# Patient Record
Sex: Female | Born: 1940
Health system: Southern US, Community
[De-identification: ages and names within clinical notes are randomized; demographics above are authoritative.]

## PROBLEM LIST (undated history)

## (undated) DIAGNOSIS — R112 Nausea with vomiting, unspecified: Secondary | ICD-10-CM

## (undated) DIAGNOSIS — E7800 Pure hypercholesterolemia, unspecified: Secondary | ICD-10-CM

## (undated) DIAGNOSIS — Z9889 Other specified postprocedural states: Secondary | ICD-10-CM

## (undated) DIAGNOSIS — H532 Diplopia: Secondary | ICD-10-CM

## (undated) HISTORY — DX: Pure hypercholesterolemia, unspecified: E78.00

## (undated) HISTORY — PX: ABDOMINAL HYSTERECTOMY: SHX81

## (undated) HISTORY — PX: BREAST SURGERY: SHX581

## (undated) HISTORY — DX: Diplopia: H53.2

## (undated) HISTORY — PX: OTHER SURGICAL HISTORY: SHX169

## (undated) HISTORY — PX: THYROID SURGERY: SHX805

---

## 1998-03-21 ENCOUNTER — Encounter: Payer: Self-pay | Admitting: *Deleted

## 1998-03-21 ENCOUNTER — Ambulatory Visit (HOSPITAL_COMMUNITY): Admission: RE | Admit: 1998-03-21 | Discharge: 1998-03-21 | Payer: Self-pay | Admitting: *Deleted

## 2000-08-22 ENCOUNTER — Other Ambulatory Visit: Admission: RE | Admit: 2000-08-22 | Discharge: 2000-08-22 | Payer: Self-pay | Admitting: Internal Medicine

## 2014-10-26 ENCOUNTER — Encounter (INDEPENDENT_AMBULATORY_CARE_PROVIDER_SITE_OTHER): Payer: Self-pay | Admitting: *Deleted

## 2014-12-13 ENCOUNTER — Ambulatory Visit (INDEPENDENT_AMBULATORY_CARE_PROVIDER_SITE_OTHER): Payer: Medicare Other | Admitting: Internal Medicine

## 2014-12-13 ENCOUNTER — Encounter (INDEPENDENT_AMBULATORY_CARE_PROVIDER_SITE_OTHER): Payer: Self-pay | Admitting: Internal Medicine

## 2014-12-13 ENCOUNTER — Telehealth (INDEPENDENT_AMBULATORY_CARE_PROVIDER_SITE_OTHER): Payer: Self-pay | Admitting: *Deleted

## 2014-12-13 ENCOUNTER — Other Ambulatory Visit (INDEPENDENT_AMBULATORY_CARE_PROVIDER_SITE_OTHER): Payer: Self-pay | Admitting: Internal Medicine

## 2014-12-13 VITALS — BP 146/80 | HR 64 | Temp 98.1°F | Ht 60.0 in | Wt 164.8 lb

## 2014-12-13 DIAGNOSIS — H532 Diplopia: Secondary | ICD-10-CM | POA: Diagnosis not present

## 2014-12-13 DIAGNOSIS — R195 Other fecal abnormalities: Secondary | ICD-10-CM | POA: Diagnosis not present

## 2014-12-13 DIAGNOSIS — E7800 Pure hypercholesterolemia, unspecified: Secondary | ICD-10-CM

## 2014-12-13 DIAGNOSIS — Z1211 Encounter for screening for malignant neoplasm of colon: Secondary | ICD-10-CM

## 2014-12-13 NOTE — Telephone Encounter (Signed)
Patient needs trilyte 

## 2014-12-13 NOTE — Progress Notes (Signed)
   Subjective:    Patient ID: Robin Chambers, female    DOB: 06-03-1940, 74 y.o.   MRN: [id number]  HPI Referred by Dr Woody Seller for positive stool card in July.  She denies seeing any blood. There has been no change in her stools. She tells me the day of the specimen who stool was loose. Appetite is good. No weight loss.  No acid reflux. There is no abdominal pain. No melena.  Really no GI problems. Her last colonoscopy was 19 yrs ago by Dr. Owens Shark in [place]. For screening purpose and was normal per patient.   10/14/2014 H and H 12.0 and 36.9, MCV 92, Platelet ct 260  Review of Systems Past Medical History  Diagnosis Date  . High cholesterol   . Double vision     left eye    Past Surgical History  Procedure Laterality Date  . Cataract surgery    . Thyroid surgery      for nodules.  . Breast surgery      x 2 for fibroids.  . Complete hypsterectomy      tumor that burst    No Known Allergies  No current outpatient prescriptions on file prior to visit.   No current facility-administered medications on file prior to visit.   No current outpatient prescriptions on file prior to visit.   No current facility-administered medications on file prior to visit.   Current Outpatient Prescriptions  Medication Sig Dispense Refill  . aspirin 81 MG tablet Take 81 mg by mouth daily.    Marland Kitchen docusate sodium (COLACE) 100 MG capsule Take 100 mg by mouth daily.    . fluticasone (FLONASE) 50 MCG/ACT nasal spray Place into both nostrils daily.    Marland Kitchen ketotifen (ZADITOR) 0.025 % ophthalmic solution Place 1 drop into both eyes daily.    . timolol (BETIMOL) 0.5 % ophthalmic solution Place 1 drop into the left eye daily.    Marland Kitchen tretinoin (RETIN-A) 0.1 % cream Apply topically at bedtime.     No current facility-administered medications for this visit.         Objective:   Physical Exam Blood pressure 146/80, pulse 64, temperature 98.1 F (36.7 C), height 5' (1.524 m), weight 164 lb 12.8 oz  (74.753 kg).  Alert and oriented. Skin warm and dry. Oral mucosa is moist.   . Sclera anicteric, conjunctivae is pink. Thyroid not enlarged. No cervical lymphadenopathy. Lungs clear. Heart regular rate and rhythm.  Abdomen is soft. Bowel sounds are positive. No hepatomegaly. No abdominal masses felt. No tenderness.  No edema to lower extremities.        Assessment & Plan:  Heme positive stool. Colonic neoplasm needs to be ruled out. Colonic polyp, AVM, diverticular disease also in the differential. Colonoscopy. The risks and benefits such as perforation, bleeding, and infection were reviewed with the patient and is agreeable.

## 2014-12-13 NOTE — Patient Instructions (Signed)
Colonoscopy.  The risks and benefits such as perforation, bleeding, and infection were reviewed with the patient and is agreeable. 

## 2014-12-17 MED ORDER — PEG 3350-KCL-NA BICARB-NACL 420 G PO SOLR: 4000.0000 mL | Freq: Once | ORAL | Status: DC

## 2014-12-24 ENCOUNTER — Encounter (INDEPENDENT_AMBULATORY_CARE_PROVIDER_SITE_OTHER): Payer: Self-pay

## 2015-01-26 ENCOUNTER — Encounter (HOSPITAL_COMMUNITY)
Admission: RE | Disposition: A | Discharge status: Home / Self Care | Payer: Self-pay | Source: Ambulatory Visit | Attending: Internal Medicine

## 2015-01-26 ENCOUNTER — Encounter (HOSPITAL_COMMUNITY): Payer: Self-pay | Admitting: *Deleted

## 2015-01-26 ENCOUNTER — Ambulatory Visit (HOSPITAL_COMMUNITY)
Admission: RE | Admit: 2015-01-26 | Discharge: 2015-01-26 | Disposition: A | Discharge status: Home / Self Care | Payer: Medicare Other | Source: Ambulatory Visit | Attending: Internal Medicine | Admitting: Internal Medicine

## 2015-01-26 DIAGNOSIS — Z90710 Acquired absence of both cervix and uterus: Secondary | ICD-10-CM | POA: Insufficient documentation

## 2015-01-26 DIAGNOSIS — Z79899 Other long term (current) drug therapy: Secondary | ICD-10-CM | POA: Insufficient documentation

## 2015-01-26 DIAGNOSIS — Z7982 Long term (current) use of aspirin: Secondary | ICD-10-CM | POA: Insufficient documentation

## 2015-01-26 DIAGNOSIS — R195 Other fecal abnormalities: Secondary | ICD-10-CM

## 2015-01-26 DIAGNOSIS — K5730 Diverticulosis of large intestine without perforation or abscess without bleeding: Secondary | ICD-10-CM | POA: Insufficient documentation

## 2015-01-26 DIAGNOSIS — E7800 Pure hypercholesterolemia, unspecified: Secondary | ICD-10-CM | POA: Insufficient documentation

## 2015-01-26 DIAGNOSIS — K921 Melena: Secondary | ICD-10-CM | POA: Diagnosis not present

## 2015-01-26 HISTORY — DX: Nausea with vomiting, unspecified: R11.2

## 2015-01-26 HISTORY — DX: Other specified postprocedural states: Z98.890

## 2015-01-26 HISTORY — PX: COLONOSCOPY: SHX5424

## 2015-01-26 SURGERY — COLONOSCOPY
Anesthesia: Moderate Sedation

## 2015-01-26 MED ORDER — MEPERIDINE HCL 50 MG/ML IJ SOLN
INTRAMUSCULAR | Status: AC
  Filled 2015-01-26: qty 1

## 2015-01-26 MED ORDER — STERILE WATER FOR IRRIGATION IR SOLN
Status: DC | PRN
  Administered 2015-01-26: 13:00:00

## 2015-01-26 MED ORDER — MIDAZOLAM HCL 5 MG/5ML IJ SOLN
INTRAMUSCULAR | Status: AC
  Filled 2015-01-26: qty 10

## 2015-01-26 MED ORDER — MEPERIDINE HCL 50 MG/ML IJ SOLN
INTRAMUSCULAR | Status: DC | PRN
  Administered 2015-01-26 (×2): 25 mg

## 2015-01-26 MED ORDER — SODIUM CHLORIDE 0.9 % IV SOLN
INTRAVENOUS | Status: DC
  Administered 2015-01-26: 1000 mL via INTRAVENOUS

## 2015-01-26 MED ORDER — MIDAZOLAM HCL 5 MG/5ML IJ SOLN
INTRAMUSCULAR | Status: DC | PRN
  Administered 2015-01-26: 1 mg via INTRAVENOUS
  Administered 2015-01-26 (×2): 2 mg via INTRAVENOUS

## 2015-01-26 NOTE — Discharge Instructions (Signed)
Resume usual medications and high fiber diet. No driving for 24 hours. Please have Dr. Woody Seller check CBC in 2 months.    Colonoscopy, Care After These instructions give you information on caring for yourself after your procedure. Your doctor may also give you more specific instructions. Call your doctor if you have any problems or questions after your procedure. HOME CARE  Do not drive for 24 hours.  Do not sign important papers or use machinery for 24 hours.  You may shower.  You may go back to your usual activities, but go slower for the first 24 hours.  Take rest breaks often during the first 24 hours.  Walk around or use warm packs on your belly (abdomen) if you have belly cramping or gas.  Drink enough fluids to keep your pee (urine) clear or pale yellow.  Resume your normal diet. Avoid heavy or fried foods.  Avoid drinking alcohol for 24 hours or as told by your doctor.  Only take medicines as told by your doctor. If a tissue sample (biopsy) was taken during the procedure:   Do not take aspirin or blood thinners for 7 days, or as told by your doctor.  Do not drink alcohol for 7 days, or as told by your doctor.  Eat soft foods for the first 24 hours. GET HELP IF: You still have a small amount of blood in your poop (stool) 2-3 days after the procedure. GET HELP RIGHT AWAY IF:  You have more than a small amount of blood in your poop.  You see clumps of tissue (blood clots) in your poop.  Your belly is puffy (swollen).  You feel sick to your stomach (nauseous) or throw up (vomit).  You have a fever.  You have belly pain that gets worse and medicine does not help. MAKE SURE YOU:  Understand these instructions.  Will watch your condition.  Will get help right away if you are not doing well or get worse.   This information is not intended to replace advice given to you by your health care provider. Make sure you discuss any questions you have with your health  care provider.   Document Released: 02/24/2010 Document Revised: 01/27/2013 Document Reviewed: 09/29/2012 Elsevier Interactive Patient Education [employer].     Diverticulosis Diverticulosis is the condition that develops when small pouches (diverticula) form in the wall of your colon. Your colon, or large intestine, is where water is absorbed and stool is formed. The pouches form when the inside layer of your colon pushes through weak spots in the outer layers of your colon. CAUSES  No one knows exactly what causes diverticulosis. RISK FACTORS  Being older than 73. Your risk for this condition increases with age. Diverticulosis is rare in people younger than 40 years. By age 29, almost everyone has it.  Eating a low-fiber diet.  Being frequently constipated.  Being overweight.  Not getting enough exercise.  Smoking.  Taking over-the-counter pain medicines, like aspirin and ibuprofen. SYMPTOMS  Most people with diverticulosis do not have symptoms. DIAGNOSIS  Because diverticulosis often has no symptoms, health care providers often discover the condition during an exam for other colon problems. In many cases, a health care provider will diagnose diverticulosis while using a flexible scope to examine the colon (colonoscopy). TREATMENT  If you have never developed an infection related to diverticulosis, you may not need treatment. If you have had an infection before, treatment may include:  Eating more fruits, vegetables, and grains.  Taking a fiber supplement.  Taking a live bacteria supplement (probiotic).  Taking medicine to relax your colon. HOME CARE INSTRUCTIONS   Drink at least 6-8 glasses of water each day to prevent constipation.  Try not to strain when you have a bowel movement.  Keep all follow-up appointments. If you have had an infection before:  Increase the fiber in your diet as directed by your health care provider or dietitian.  Take a  dietary fiber supplement if your health care provider approves.  Only take medicines as directed by your health care provider. SEEK MEDICAL CARE IF:   You have abdominal pain.  You have bloating.  You have cramps.  You have not gone to the bathroom in 3 days. SEEK IMMEDIATE MEDICAL CARE IF:   Your pain gets worse.  Yourbloating becomes very bad.  You have a fever or chills, and your symptoms suddenly get worse.  You begin vomiting.  You have bowel movements that are bloody or black. MAKE SURE YOU:  Understand these instructions.  Will watch your condition.  Will get help right away if you are not doing well or get worse.   This information is not intended to replace advice given to you by your health care provider. Make sure you discuss any questions you have with your health care provider.   Document Released: 10/20/2003 Document Revised: 01/27/2013 Document Reviewed: 12/17/2012 Elsevier Interactive Patient Education 2016 Elsevier Inc.   High-Fiber Diet Fiber, also called dietary fiber, is a type of carbohydrate found in fruits, vegetables, whole grains, and beans. A high-fiber diet can have many health benefits. Your health care provider may recommend a high-fiber diet to help:  Prevent constipation. Fiber can make your bowel movements more regular.  Lower your cholesterol.  Relieve hemorrhoids, uncomplicated diverticulosis, or irritable bowel syndrome.  Prevent overeating as part of a weight-loss plan.  Prevent heart disease, type 2 diabetes, and certain cancers. WHAT IS MY PLAN? The recommended daily intake of fiber includes:  38 grams for men under age 17.  32 grams for men over age 40.  21 grams for women under age 74.  38 grams for women over age 39. You can get the recommended daily intake of dietary fiber by eating a variety of fruits, vegetables, grains, and beans. Your health care provider may also recommend a fiber supplement if it is not  possible to get enough fiber through your diet. WHAT DO I NEED TO KNOW ABOUT A HIGH-FIBER DIET?  Fiber supplements have not been widely studied for their effectiveness, so it is better to get fiber through food sources.  Always check the fiber content on thenutrition facts label of any prepackaged food. Look for foods that contain at least 5 grams of fiber per serving.  Ask your dietitian if you have questions about specific foods that are related to your condition, especially if those foods are not listed in the following section.  Increase your daily fiber consumption gradually. Increasing your intake of dietary fiber too quickly may cause bloating, cramping, or gas.  Drink plenty of water. Water helps you to digest fiber. WHAT FOODS CAN I EAT? Grains Whole-grain breads. Multigrain cereal. Oats and oatmeal. Brown rice. Barley. Bulgur wheat. [place]. Bran muffins. Popcorn. Rye wafer crackers. Vegetables Sweet potatoes. Spinach. Kale. Artichokes. Cabbage. Broccoli. Green peas. Carrots. Squash. Fruits Berries. Pears. Apples. Oranges. Avocados. Prunes and raisins. Dried figs. Meats and Other Protein Sources Navy, kidney, pinto, and soy beans. Split peas. Lentils. Nuts and seeds. Dairy  Fiber-fortified yogurt. Beverages Fiber-fortified soy milk. Fiber-fortified orange juice. Other Fiber bars. The items listed above may not be a complete list of recommended foods or beverages. Contact your dietitian for more options. WHAT FOODS ARE NOT RECOMMENDED? Grains White bread. Pasta made with refined flour. White rice. Vegetables Fried potatoes. Canned vegetables. Well-cooked vegetables.  Fruits Fruit juice. Cooked, strained fruit. Meats and Other Protein Sources Fatty cuts of meat. Fried Sales executive or fried fish. Dairy Milk. Yogurt. Cream cheese. Sour cream. Beverages Soft drinks. Other Cakes and pastries. Butter and oils. The items listed above may not be a complete list of foods and  beverages to avoid. Contact your dietitian for more information. WHAT ARE SOME TIPS FOR INCLUDING HIGH-FIBER FOODS IN MY DIET?  Eat a wide variety of high-fiber foods.  Make sure that half of all grains consumed each day are whole grains.  Replace breads and cereals made from refined flour or white flour with whole-grain breads and cereals.  Replace white rice with brown rice, bulgur wheat, or millet.  Start the day with a breakfast that is high in fiber, such as a cereal that contains at least 5 grams of fiber per serving.  Use beans in place of meat in soups, salads, or pasta.  Eat high-fiber snacks, such as berries, raw vegetables, nuts, or popcorn.   This information is not intended to replace advice given to you by your health care provider. Make sure you discuss any questions you have with your health care provider.   Document Released: 01/22/2005 Document Revised: 02/12/2014 Document Reviewed: 07/07/2013 Elsevier Interactive Patient Education [employer].

## 2015-01-26 NOTE — H&P (Signed)
Robin Chambers is an 74 y.o. female.   Chief Complaint: Patient is here for colonoscopy. HPI: Patient is 74 year old Caucasian female is recently found to have heme-positive stool and is here for diagnostic colonoscopy. She denies abdominal pain change in bowel habits rectal bleeding. She is on low-dose aspirin and takes 1 Advil no more than once a week. Last colonoscopy was done over 10 years ago. Family History is negative for CRC.  Past Medical History  Diagnosis Date  . High cholesterol   . Double vision     left eye  . PONV (postoperative nausea and vomiting)     Past Surgical History  Procedure Laterality Date  . Cataract surgery    . Thyroid surgery      for nodules.  . Breast surgery      x 2 for fibroids.  . Complete hypsterectomy      tumor that burst  . Abdominal hysterectomy      History reviewed. No pertinent family history. Social History:  reports that she has never smoked. She does not have any smokeless tobacco history on file. She reports that she does not drink alcohol or use illicit drugs.  Allergies: No Known Allergies  Medications Prior to Admission  Medication Sig Dispense Refill  . acetaminophen (TYLENOL) 500 MG tablet Take 500 mg by mouth every 4 (four) hours as needed for moderate pain or fever.    Marland Kitchen aspirin 81 MG tablet Take 81 mg by mouth daily.    . Calcium Carbonate-Vit D-Min (CALCIUM 1200 PO) Take 1 tablet by mouth daily.    . cetirizine (ZYRTEC) 10 MG tablet Take 10 mg by mouth daily.    Marland Kitchen docusate sodium (COLACE) 100 MG capsule Take 100 mg by mouth daily.    . fluticasone (FLONASE) 50 MCG/ACT nasal spray Place 2 sprays into both nostrils daily.     . Multiple Vitamin (MULTIVITAMIN WITH MINERALS) TABS tablet Take 1 tablet by mouth daily.    . polyethylene glycol-electrolytes (NULYTELY/GOLYTELY) 420 G solution Take 4,000 mLs by mouth once. 4000 mL 0  . simvastatin (ZOCOR) 20 MG tablet Take 20 mg by mouth daily.    . timolol (BETIMOL) 0.5 %  ophthalmic solution Place 1 drop into the left eye daily.    . Vitamin D, Ergocalciferol, (DRISDOL) 50000 UNITS CAPS capsule Take 1 capsule by mouth once a week.    . tretinoin (RETIN-A) 0.1 % cream Apply 1 application topically at bedtime.       No results found for this or any previous visit (from the past 48 hour(s)). No results found.  ROS  Blood pressure 151/67, pulse 67, temperature 97.9 F (36.6 C), temperature source Oral, resp. rate 16, height 5\' 2"  (1.575 m), weight 160 lb (72.576 kg), SpO2 98 %. Physical Exam  Constitutional: She appears well-developed and well-nourished.  HENT:  Mouth/Throat: Oropharynx is clear and moist.  Left ear is covered with dressing.  Eyes: Conjunctivae are normal. No scleral icterus.  Neck: No thyromegaly present.  Cardiovascular: Normal rate, regular rhythm and normal heart sounds.   No murmur heard. Respiratory: Effort normal and breath sounds normal.  GI: Soft. She exhibits no distension and no mass. There is no tenderness.  Musculoskeletal: She exhibits no edema.  Lymphadenopathy:    She has no cervical adenopathy.  Neurological: She is alert.  Skin: Skin is warm and dry.     Assessment/Plan Heme positive stool. Diagnostic colonoscopy.  Andrue Dini U 01/26/2015, 12:31 PM

## 2015-01-26 NOTE — Op Note (Signed)
COLONOSCOPY PROCEDURE REPORT  PATIENT:  Robin Chambers  MR#:  [id number] Birthdate:  10-Feb-1940, 74 y.o., female Endoscopist:  Dr. Rogene Houston, MD Referred By:  Dr. Glenda Chroman, MD Procedure Date: 01/26/2015  Procedure:   Colonoscopy  Indications:  Patient is 74 year old Caucasian female who was recently noted have heme-positive stool. Her H&H was normal. She is undergoing diagnostic colonoscopy. She denies abdominal pain melena or rectal bleeding or recent change in her bowel habits. Last colonoscopy was several years ago.  Informed Consent:  The procedure and risks were reviewed with the patient and informed consent was obtained.  Medications:  Demerol 50 mg IV Versed 5 mg IV  Description of procedure:  After a digital rectal exam was performed, that colonoscope was advanced from the anus through the rectum and colon to the area of the cecum, ileocecal valve and appendiceal orifice. The cecum was deeply intubated. These structures were well-seen and photographed for the record. From the level of the cecum and ileocecal valve, the scope was slowly and cautiously withdrawn. The mucosal surfaces were carefully surveyed utilizing scope tip to flexion to facilitate fold flattening as needed. The scope was pulled down into the rectum where a thorough exam including retroflexion was performed.  Findings:   Prep satisfactory. Normal mucosa of cecum, ascending colon, hepatic flexure, transverse colon, splenic flexure and descending colon. Few small scattered diverticula noted at sigmoid colon. Normal rectal mucosa and anal rectal junction.   Therapeutic/Diagnostic Maneuvers Performed:   None  Complications:  None  EBL: None  Cecal Withdrawal Time:  8 minutes  Impression:  Mild sigmoid colon diverticulosis otherwise normal colonoscopy.  Recommendations:  Standard instructions given. High-fiber diet. No further workup other than CBC in two months by Dr. Woody Seller.  [name]   01/26/2015 1:11 PM  CC: Dr. Glenda Chroman., MD & Dr. Rayne Du ref. provider found

## 2015-02-01 ENCOUNTER — Encounter (HOSPITAL_COMMUNITY): Payer: Self-pay | Admitting: Internal Medicine

## 2015-02-16 DIAGNOSIS — H401422 Capsular glaucoma with pseudoexfoliation of lens, left eye, moderate stage: Secondary | ICD-10-CM | POA: Diagnosis not present

## 2015-02-16 DIAGNOSIS — Z961 Presence of intraocular lens: Secondary | ICD-10-CM | POA: Diagnosis not present

## 2015-02-16 DIAGNOSIS — H04123 Dry eye syndrome of bilateral lacrimal glands: Secondary | ICD-10-CM | POA: Diagnosis not present

## 2015-02-21 ENCOUNTER — Telehealth (INDEPENDENT_AMBULATORY_CARE_PROVIDER_SITE_OTHER): Payer: Self-pay | Admitting: Internal Medicine

## 2015-02-21 DIAGNOSIS — K921 Melena: Secondary | ICD-10-CM

## 2015-02-21 NOTE — Telephone Encounter (Signed)
Patient was called and advised that the lab work was due 03/29/15. A order will be faxed to [facility] per Tina's instruction.

## 2015-02-21 NOTE — Telephone Encounter (Signed)
Per Dr.Rehman's note- the patient is to have a CBC in two months by Dr.Vyas. Called their office and was told be Otila Kluver that we would need to do the order and she would give it to the lab person. Not due until February as the TCS was 01/26/15. Order will be faxed to [facility].

## 2015-02-21 NOTE — Telephone Encounter (Signed)
Ms. Bellew left a message on the voice mail saying Dr. Laural Golden suggested she go to [facility] after her colonoscopy for a "CBC" follow-up. She's wondering if she needs a referral or if she can just call and make an appt. She'd like a phone call regarding this.  Pt's ph# [phone] or [phone]  Thank you.

## 2015-03-24 DIAGNOSIS — L570 Actinic keratosis: Secondary | ICD-10-CM | POA: Diagnosis not present

## 2015-03-24 DIAGNOSIS — Z85828 Personal history of other malignant neoplasm of skin: Secondary | ICD-10-CM | POA: Diagnosis not present

## 2015-03-29 DIAGNOSIS — D649 Anemia, unspecified: Secondary | ICD-10-CM | POA: Diagnosis not present

## 2015-04-14 ENCOUNTER — Encounter (INDEPENDENT_AMBULATORY_CARE_PROVIDER_SITE_OTHER): Payer: Self-pay

## 2015-05-12 DIAGNOSIS — S00411A Abrasion of right ear, initial encounter: Secondary | ICD-10-CM | POA: Diagnosis not present

## 2015-06-07 DIAGNOSIS — H00025 Hordeolum internum left lower eyelid: Secondary | ICD-10-CM | POA: Diagnosis not present

## 2015-06-17 DIAGNOSIS — H00025 Hordeolum internum left lower eyelid: Secondary | ICD-10-CM | POA: Diagnosis not present

## 2015-06-27 DIAGNOSIS — Z85828 Personal history of other malignant neoplasm of skin: Secondary | ICD-10-CM | POA: Diagnosis not present

## 2015-06-27 DIAGNOSIS — H01006 Unspecified blepharitis left eye, unspecified eyelid: Secondary | ICD-10-CM | POA: Diagnosis not present

## 2015-06-27 DIAGNOSIS — H00035 Abscess of left lower eyelid: Secondary | ICD-10-CM | POA: Diagnosis not present

## 2015-06-27 DIAGNOSIS — H6001 Abscess of right external ear: Secondary | ICD-10-CM | POA: Diagnosis not present

## 2015-06-27 DIAGNOSIS — L821 Other seborrheic keratosis: Secondary | ICD-10-CM | POA: Diagnosis not present

## 2015-08-15 DIAGNOSIS — Z961 Presence of intraocular lens: Secondary | ICD-10-CM | POA: Diagnosis not present

## 2015-08-15 DIAGNOSIS — H0014 Chalazion left upper eyelid: Secondary | ICD-10-CM | POA: Diagnosis not present

## 2015-08-26 DIAGNOSIS — R351 Nocturia: Secondary | ICD-10-CM | POA: Diagnosis not present

## 2015-08-26 DIAGNOSIS — R350 Frequency of micturition: Secondary | ICD-10-CM | POA: Diagnosis not present

## 2015-08-26 DIAGNOSIS — R3129 Other microscopic hematuria: Secondary | ICD-10-CM | POA: Diagnosis not present

## 2015-08-30 DIAGNOSIS — Z789 Other specified health status: Secondary | ICD-10-CM | POA: Diagnosis not present

## 2015-08-30 DIAGNOSIS — Z299 Encounter for prophylactic measures, unspecified: Secondary | ICD-10-CM | POA: Diagnosis not present

## 2015-08-30 DIAGNOSIS — H00019 Hordeolum externum unspecified eye, unspecified eyelid: Secondary | ICD-10-CM | POA: Diagnosis not present

## 2015-09-08 DIAGNOSIS — H01021 Squamous blepharitis right upper eyelid: Secondary | ICD-10-CM | POA: Diagnosis not present

## 2015-09-08 DIAGNOSIS — H01024 Squamous blepharitis left upper eyelid: Secondary | ICD-10-CM | POA: Diagnosis not present

## 2015-09-08 DIAGNOSIS — H01025 Squamous blepharitis left lower eyelid: Secondary | ICD-10-CM | POA: Diagnosis not present

## 2015-09-08 DIAGNOSIS — Z961 Presence of intraocular lens: Secondary | ICD-10-CM | POA: Diagnosis not present

## 2015-09-08 DIAGNOSIS — H00024 Hordeolum internum left upper eyelid: Secondary | ICD-10-CM | POA: Diagnosis not present

## 2015-09-08 DIAGNOSIS — H01022 Squamous blepharitis right lower eyelid: Secondary | ICD-10-CM | POA: Diagnosis not present

## 2015-09-21 DIAGNOSIS — L720 Epidermal cyst: Secondary | ICD-10-CM | POA: Diagnosis not present

## 2015-09-21 DIAGNOSIS — Z85828 Personal history of other malignant neoplasm of skin: Secondary | ICD-10-CM | POA: Diagnosis not present

## 2015-09-21 DIAGNOSIS — L570 Actinic keratosis: Secondary | ICD-10-CM | POA: Diagnosis not present

## 2015-10-12 DIAGNOSIS — Z961 Presence of intraocular lens: Secondary | ICD-10-CM | POA: Diagnosis not present

## 2015-10-12 DIAGNOSIS — H01024 Squamous blepharitis left upper eyelid: Secondary | ICD-10-CM | POA: Diagnosis not present

## 2015-10-12 DIAGNOSIS — H01021 Squamous blepharitis right upper eyelid: Secondary | ICD-10-CM | POA: Diagnosis not present

## 2015-10-12 DIAGNOSIS — H01022 Squamous blepharitis right lower eyelid: Secondary | ICD-10-CM | POA: Diagnosis not present

## 2015-10-12 DIAGNOSIS — H01025 Squamous blepharitis left lower eyelid: Secondary | ICD-10-CM | POA: Diagnosis not present

## 2015-10-20 DIAGNOSIS — Z7189 Other specified counseling: Secondary | ICD-10-CM | POA: Diagnosis not present

## 2015-10-20 DIAGNOSIS — R5383 Other fatigue: Secondary | ICD-10-CM | POA: Diagnosis not present

## 2015-10-20 DIAGNOSIS — Z79899 Other long term (current) drug therapy: Secondary | ICD-10-CM | POA: Diagnosis not present

## 2015-10-20 DIAGNOSIS — Z6828 Body mass index (BMI) 28.0-28.9, adult: Secondary | ICD-10-CM | POA: Diagnosis not present

## 2015-10-20 DIAGNOSIS — Z1211 Encounter for screening for malignant neoplasm of colon: Secondary | ICD-10-CM | POA: Diagnosis not present

## 2015-10-20 DIAGNOSIS — Z1389 Encounter for screening for other disorder: Secondary | ICD-10-CM | POA: Diagnosis not present

## 2015-10-20 DIAGNOSIS — Z0000 Encounter for general adult medical examination without abnormal findings: Secondary | ICD-10-CM | POA: Diagnosis not present

## 2015-10-20 DIAGNOSIS — E559 Vitamin D deficiency, unspecified: Secondary | ICD-10-CM | POA: Diagnosis not present

## 2015-10-20 DIAGNOSIS — Z299 Encounter for prophylactic measures, unspecified: Secondary | ICD-10-CM | POA: Diagnosis not present

## 2015-10-20 DIAGNOSIS — E7800 Pure hypercholesterolemia, unspecified: Secondary | ICD-10-CM | POA: Diagnosis not present

## 2015-10-24 DIAGNOSIS — Z1231 Encounter for screening mammogram for malignant neoplasm of breast: Secondary | ICD-10-CM | POA: Diagnosis not present

## 2015-10-31 DIAGNOSIS — M25579 Pain in unspecified ankle and joints of unspecified foot: Secondary | ICD-10-CM | POA: Diagnosis not present

## 2015-10-31 DIAGNOSIS — M79671 Pain in right foot: Secondary | ICD-10-CM | POA: Diagnosis not present

## 2015-11-02 DIAGNOSIS — I10 Essential (primary) hypertension: Secondary | ICD-10-CM | POA: Diagnosis not present

## 2015-11-02 DIAGNOSIS — Z6829 Body mass index (BMI) 29.0-29.9, adult: Secondary | ICD-10-CM | POA: Diagnosis not present

## 2015-11-02 DIAGNOSIS — Z713 Dietary counseling and surveillance: Secondary | ICD-10-CM | POA: Diagnosis not present

## 2015-11-28 DIAGNOSIS — M76821 Posterior tibial tendinitis, right leg: Secondary | ICD-10-CM | POA: Diagnosis not present

## 2015-11-28 DIAGNOSIS — M79671 Pain in right foot: Secondary | ICD-10-CM | POA: Diagnosis not present

## 2015-11-28 DIAGNOSIS — M25579 Pain in unspecified ankle and joints of unspecified foot: Secondary | ICD-10-CM | POA: Diagnosis not present

## 2015-11-28 DIAGNOSIS — Z23 Encounter for immunization: Secondary | ICD-10-CM | POA: Diagnosis not present

## 2015-12-05 DIAGNOSIS — M79671 Pain in right foot: Secondary | ICD-10-CM | POA: Diagnosis not present

## 2015-12-05 DIAGNOSIS — M722 Plantar fascial fibromatosis: Secondary | ICD-10-CM | POA: Diagnosis not present

## 2015-12-05 DIAGNOSIS — M25579 Pain in unspecified ankle and joints of unspecified foot: Secondary | ICD-10-CM | POA: Diagnosis not present

## 2015-12-05 DIAGNOSIS — M79672 Pain in left foot: Secondary | ICD-10-CM | POA: Diagnosis not present

## 2015-12-21 DIAGNOSIS — L570 Actinic keratosis: Secondary | ICD-10-CM | POA: Diagnosis not present

## 2015-12-21 DIAGNOSIS — Z85828 Personal history of other malignant neoplasm of skin: Secondary | ICD-10-CM | POA: Diagnosis not present

## 2016-01-10 DIAGNOSIS — M79671 Pain in right foot: Secondary | ICD-10-CM | POA: Diagnosis not present

## 2016-01-10 DIAGNOSIS — M76821 Posterior tibial tendinitis, right leg: Secondary | ICD-10-CM | POA: Diagnosis not present

## 2016-01-10 DIAGNOSIS — M25579 Pain in unspecified ankle and joints of unspecified foot: Secondary | ICD-10-CM | POA: Diagnosis not present

## 2016-01-10 DIAGNOSIS — Z23 Encounter for immunization: Secondary | ICD-10-CM | POA: Diagnosis not present

## 2016-01-29 DIAGNOSIS — S50311A Abrasion of right elbow, initial encounter: Secondary | ICD-10-CM | POA: Diagnosis present

## 2016-01-29 DIAGNOSIS — M80051A Age-related osteoporosis with current pathological fracture, right femur, initial encounter for fracture: Secondary | ICD-10-CM | POA: Diagnosis present

## 2016-01-29 DIAGNOSIS — M160 Bilateral primary osteoarthritis of hip: Secondary | ICD-10-CM | POA: Diagnosis not present

## 2016-01-29 DIAGNOSIS — Z7982 Long term (current) use of aspirin: Secondary | ICD-10-CM | POA: Diagnosis not present

## 2016-01-29 DIAGNOSIS — T148XXA Other injury of unspecified body region, initial encounter: Secondary | ICD-10-CM | POA: Diagnosis not present

## 2016-01-29 DIAGNOSIS — Z66 Do not resuscitate: Secondary | ICD-10-CM | POA: Diagnosis not present

## 2016-01-29 DIAGNOSIS — H409 Unspecified glaucoma: Secondary | ICD-10-CM | POA: Diagnosis present

## 2016-01-29 DIAGNOSIS — M76892 Other specified enthesopathies of left lower limb, excluding foot: Secondary | ICD-10-CM | POA: Diagnosis not present

## 2016-01-29 DIAGNOSIS — Z9181 History of falling: Secondary | ICD-10-CM | POA: Diagnosis not present

## 2016-01-29 DIAGNOSIS — S50312A Abrasion of left elbow, initial encounter: Secondary | ICD-10-CM | POA: Diagnosis present

## 2016-01-29 DIAGNOSIS — Z79899 Other long term (current) drug therapy: Secondary | ICD-10-CM | POA: Diagnosis not present

## 2016-01-29 DIAGNOSIS — M8000XD Age-related osteoporosis with current pathological fracture, unspecified site, subsequent encounter for fracture with routine healing: Secondary | ICD-10-CM | POA: Diagnosis not present

## 2016-01-29 DIAGNOSIS — E785 Hyperlipidemia, unspecified: Secondary | ICD-10-CM | POA: Diagnosis present

## 2016-01-29 DIAGNOSIS — B9689 Other specified bacterial agents as the cause of diseases classified elsewhere: Secondary | ICD-10-CM | POA: Diagnosis not present

## 2016-01-29 DIAGNOSIS — S72111A Displaced fracture of greater trochanter of right femur, initial encounter for closed fracture: Secondary | ICD-10-CM | POA: Diagnosis not present

## 2016-01-29 DIAGNOSIS — M533 Sacrococcygeal disorders, not elsewhere classified: Secondary | ICD-10-CM | POA: Diagnosis not present

## 2016-01-29 DIAGNOSIS — E079 Disorder of thyroid, unspecified: Secondary | ICD-10-CM | POA: Diagnosis not present

## 2016-01-29 DIAGNOSIS — Z4789 Encounter for other orthopedic aftercare: Secondary | ICD-10-CM | POA: Diagnosis not present

## 2016-01-29 DIAGNOSIS — M5136 Other intervertebral disc degeneration, lumbar region: Secondary | ICD-10-CM | POA: Diagnosis not present

## 2016-01-29 DIAGNOSIS — M810 Age-related osteoporosis without current pathological fracture: Secondary | ICD-10-CM | POA: Diagnosis not present

## 2016-01-29 DIAGNOSIS — Z7409 Other reduced mobility: Secondary | ICD-10-CM | POA: Diagnosis not present

## 2016-01-29 DIAGNOSIS — M1990 Unspecified osteoarthritis, unspecified site: Secondary | ICD-10-CM | POA: Diagnosis present

## 2016-01-29 DIAGNOSIS — S72114D Nondisplaced fracture of greater trochanter of right femur, subsequent encounter for closed fracture with routine healing: Secondary | ICD-10-CM | POA: Diagnosis not present

## 2016-01-29 DIAGNOSIS — R262 Difficulty in walking, not elsewhere classified: Secondary | ICD-10-CM | POA: Diagnosis not present

## 2016-01-29 DIAGNOSIS — S72001A Fracture of unspecified part of neck of right femur, initial encounter for closed fracture: Secondary | ICD-10-CM | POA: Diagnosis not present

## 2016-01-29 DIAGNOSIS — R9431 Abnormal electrocardiogram [ECG] [EKG]: Secondary | ICD-10-CM | POA: Diagnosis not present

## 2016-01-29 DIAGNOSIS — W108XXA Fall (on) (from) other stairs and steps, initial encounter: Secondary | ICD-10-CM | POA: Diagnosis not present

## 2016-01-29 DIAGNOSIS — M1612 Unilateral primary osteoarthritis, left hip: Secondary | ICD-10-CM | POA: Diagnosis not present

## 2016-01-29 DIAGNOSIS — E039 Hypothyroidism, unspecified: Secondary | ICD-10-CM | POA: Diagnosis not present

## 2016-01-29 DIAGNOSIS — Z90710 Acquired absence of both cervix and uterus: Secondary | ICD-10-CM | POA: Diagnosis not present

## 2016-01-29 DIAGNOSIS — S79912A Unspecified injury of left hip, initial encounter: Secondary | ICD-10-CM | POA: Diagnosis not present

## 2016-01-29 DIAGNOSIS — D649 Anemia, unspecified: Secondary | ICD-10-CM | POA: Diagnosis not present

## 2016-01-29 DIAGNOSIS — M25551 Pain in right hip: Secondary | ICD-10-CM | POA: Diagnosis not present

## 2016-01-29 DIAGNOSIS — M6281 Muscle weakness (generalized): Secondary | ICD-10-CM | POA: Diagnosis not present

## 2016-01-29 DIAGNOSIS — S79911A Unspecified injury of right hip, initial encounter: Secondary | ICD-10-CM | POA: Diagnosis not present

## 2016-01-29 DIAGNOSIS — S72114A Nondisplaced fracture of greater trochanter of right femur, initial encounter for closed fracture: Secondary | ICD-10-CM | POA: Diagnosis not present

## 2016-01-29 DIAGNOSIS — M8580 Other specified disorders of bone density and structure, unspecified site: Secondary | ICD-10-CM | POA: Diagnosis present

## 2016-01-29 DIAGNOSIS — M25552 Pain in left hip: Secondary | ICD-10-CM | POA: Diagnosis not present

## 2016-01-29 DIAGNOSIS — J019 Acute sinusitis, unspecified: Secondary | ICD-10-CM | POA: Diagnosis not present

## 2016-02-01 DIAGNOSIS — M810 Age-related osteoporosis without current pathological fracture: Secondary | ICD-10-CM | POA: Diagnosis not present

## 2016-02-01 DIAGNOSIS — B9689 Other specified bacterial agents as the cause of diseases classified elsewhere: Secondary | ICD-10-CM | POA: Diagnosis not present

## 2016-02-01 DIAGNOSIS — M5136 Other intervertebral disc degeneration, lumbar region: Secondary | ICD-10-CM | POA: Diagnosis not present

## 2016-02-01 DIAGNOSIS — M533 Sacrococcygeal disorders, not elsewhere classified: Secondary | ICD-10-CM | POA: Diagnosis not present

## 2016-02-01 DIAGNOSIS — E079 Disorder of thyroid, unspecified: Secondary | ICD-10-CM | POA: Diagnosis not present

## 2016-02-01 DIAGNOSIS — M8580 Other specified disorders of bone density and structure, unspecified site: Secondary | ICD-10-CM | POA: Diagnosis not present

## 2016-02-01 DIAGNOSIS — M1612 Unilateral primary osteoarthritis, left hip: Secondary | ICD-10-CM | POA: Diagnosis not present

## 2016-02-01 DIAGNOSIS — J301 Allergic rhinitis due to pollen: Secondary | ICD-10-CM | POA: Diagnosis not present

## 2016-02-01 DIAGNOSIS — S72114D Nondisplaced fracture of greater trochanter of right femur, subsequent encounter for closed fracture with routine healing: Secondary | ICD-10-CM | POA: Diagnosis not present

## 2016-02-01 DIAGNOSIS — M6281 Muscle weakness (generalized): Secondary | ICD-10-CM | POA: Diagnosis not present

## 2016-02-01 DIAGNOSIS — M130 Polyarthritis, unspecified: Secondary | ICD-10-CM | POA: Diagnosis not present

## 2016-02-01 DIAGNOSIS — M8000XD Age-related osteoporosis with current pathological fracture, unspecified site, subsequent encounter for fracture with routine healing: Secondary | ICD-10-CM | POA: Diagnosis not present

## 2016-02-01 DIAGNOSIS — M8000XA Age-related osteoporosis with current pathological fracture, unspecified site, initial encounter for fracture: Secondary | ICD-10-CM | POA: Diagnosis not present

## 2016-02-01 DIAGNOSIS — R29890 Loss of height: Secondary | ICD-10-CM | POA: Diagnosis not present

## 2016-02-01 DIAGNOSIS — S72114A Nondisplaced fracture of greater trochanter of right femur, initial encounter for closed fracture: Secondary | ICD-10-CM | POA: Diagnosis not present

## 2016-02-01 DIAGNOSIS — J019 Acute sinusitis, unspecified: Secondary | ICD-10-CM | POA: Diagnosis not present

## 2016-02-01 DIAGNOSIS — B351 Tinea unguium: Secondary | ICD-10-CM | POA: Diagnosis not present

## 2016-02-01 DIAGNOSIS — R262 Difficulty in walking, not elsewhere classified: Secondary | ICD-10-CM | POA: Diagnosis not present

## 2016-02-01 DIAGNOSIS — Z8262 Family history of osteoporosis: Secondary | ICD-10-CM | POA: Diagnosis not present

## 2016-02-01 DIAGNOSIS — H409 Unspecified glaucoma: Secondary | ICD-10-CM | POA: Diagnosis not present

## 2016-02-01 DIAGNOSIS — Z7409 Other reduced mobility: Secondary | ICD-10-CM | POA: Diagnosis not present

## 2016-02-01 DIAGNOSIS — I10 Essential (primary) hypertension: Secondary | ICD-10-CM | POA: Diagnosis not present

## 2016-02-01 DIAGNOSIS — E785 Hyperlipidemia, unspecified: Secondary | ICD-10-CM | POA: Diagnosis not present

## 2016-02-01 DIAGNOSIS — M25551 Pain in right hip: Secondary | ICD-10-CM | POA: Diagnosis not present

## 2016-02-01 DIAGNOSIS — M76892 Other specified enthesopathies of left lower limb, excluding foot: Secondary | ICD-10-CM | POA: Diagnosis not present

## 2016-02-01 DIAGNOSIS — E039 Hypothyroidism, unspecified: Secondary | ICD-10-CM | POA: Diagnosis not present

## 2016-02-01 DIAGNOSIS — Z9181 History of falling: Secondary | ICD-10-CM | POA: Diagnosis not present

## 2016-02-01 DIAGNOSIS — M79676 Pain in unspecified toe(s): Secondary | ICD-10-CM | POA: Diagnosis not present

## 2016-02-01 DIAGNOSIS — E782 Mixed hyperlipidemia: Secondary | ICD-10-CM | POA: Diagnosis not present

## 2016-02-01 DIAGNOSIS — Z4789 Encounter for other orthopedic aftercare: Secondary | ICD-10-CM | POA: Diagnosis not present

## 2016-02-02 DIAGNOSIS — S72114A Nondisplaced fracture of greater trochanter of right femur, initial encounter for closed fracture: Secondary | ICD-10-CM | POA: Diagnosis not present

## 2016-02-02 DIAGNOSIS — M8000XA Age-related osteoporosis with current pathological fracture, unspecified site, initial encounter for fracture: Secondary | ICD-10-CM | POA: Diagnosis not present

## 2016-02-02 DIAGNOSIS — I10 Essential (primary) hypertension: Secondary | ICD-10-CM | POA: Diagnosis not present

## 2016-02-02 DIAGNOSIS — E782 Mixed hyperlipidemia: Secondary | ICD-10-CM | POA: Diagnosis not present

## 2016-02-07 DIAGNOSIS — J019 Acute sinusitis, unspecified: Secondary | ICD-10-CM | POA: Diagnosis not present

## 2016-02-10 DIAGNOSIS — B9689 Other specified bacterial agents as the cause of diseases classified elsewhere: Secondary | ICD-10-CM | POA: Diagnosis not present

## 2016-02-10 DIAGNOSIS — J019 Acute sinusitis, unspecified: Secondary | ICD-10-CM | POA: Diagnosis not present

## 2016-02-14 DIAGNOSIS — I10 Essential (primary) hypertension: Secondary | ICD-10-CM | POA: Diagnosis not present

## 2016-02-14 DIAGNOSIS — M130 Polyarthritis, unspecified: Secondary | ICD-10-CM | POA: Diagnosis not present

## 2016-02-14 DIAGNOSIS — J301 Allergic rhinitis due to pollen: Secondary | ICD-10-CM | POA: Diagnosis not present

## 2016-02-14 DIAGNOSIS — E039 Hypothyroidism, unspecified: Secondary | ICD-10-CM | POA: Diagnosis not present

## 2016-02-17 DIAGNOSIS — J019 Acute sinusitis, unspecified: Secondary | ICD-10-CM | POA: Diagnosis not present

## 2016-02-21 DIAGNOSIS — M79676 Pain in unspecified toe(s): Secondary | ICD-10-CM | POA: Diagnosis not present

## 2016-02-21 DIAGNOSIS — S72114A Nondisplaced fracture of greater trochanter of right femur, initial encounter for closed fracture: Secondary | ICD-10-CM | POA: Diagnosis not present

## 2016-02-21 DIAGNOSIS — R29890 Loss of height: Secondary | ICD-10-CM | POA: Diagnosis not present

## 2016-02-21 DIAGNOSIS — B351 Tinea unguium: Secondary | ICD-10-CM | POA: Diagnosis not present

## 2016-02-21 DIAGNOSIS — Z8262 Family history of osteoporosis: Secondary | ICD-10-CM | POA: Diagnosis not present

## 2016-02-21 DIAGNOSIS — M810 Age-related osteoporosis without current pathological fracture: Secondary | ICD-10-CM | POA: Diagnosis not present

## 2016-02-21 DIAGNOSIS — Z9181 History of falling: Secondary | ICD-10-CM | POA: Diagnosis not present

## 2016-02-21 DIAGNOSIS — M8580 Other specified disorders of bone density and structure, unspecified site: Secondary | ICD-10-CM | POA: Diagnosis not present

## 2016-02-21 DIAGNOSIS — E039 Hypothyroidism, unspecified: Secondary | ICD-10-CM | POA: Diagnosis not present

## 2016-02-27 DIAGNOSIS — M8000XD Age-related osteoporosis with current pathological fracture, unspecified site, subsequent encounter for fracture with routine healing: Secondary | ICD-10-CM | POA: Diagnosis not present

## 2016-02-27 DIAGNOSIS — S72114D Nondisplaced fracture of greater trochanter of right femur, subsequent encounter for closed fracture with routine healing: Secondary | ICD-10-CM | POA: Diagnosis not present

## 2016-02-27 DIAGNOSIS — I10 Essential (primary) hypertension: Secondary | ICD-10-CM | POA: Diagnosis not present

## 2016-02-27 DIAGNOSIS — E782 Mixed hyperlipidemia: Secondary | ICD-10-CM | POA: Diagnosis not present

## 2016-03-01 DIAGNOSIS — M810 Age-related osteoporosis without current pathological fracture: Secondary | ICD-10-CM | POA: Diagnosis not present

## 2016-03-01 DIAGNOSIS — S72114A Nondisplaced fracture of greater trochanter of right femur, initial encounter for closed fracture: Secondary | ICD-10-CM | POA: Diagnosis not present

## 2016-03-01 DIAGNOSIS — Z8262 Family history of osteoporosis: Secondary | ICD-10-CM | POA: Diagnosis not present

## 2016-03-01 DIAGNOSIS — Z9181 History of falling: Secondary | ICD-10-CM | POA: Diagnosis not present

## 2016-03-01 DIAGNOSIS — Z79899 Other long term (current) drug therapy: Secondary | ICD-10-CM | POA: Diagnosis not present

## 2016-03-01 DIAGNOSIS — E785 Hyperlipidemia, unspecified: Secondary | ICD-10-CM | POA: Diagnosis not present

## 2016-03-01 DIAGNOSIS — S72141D Displaced intertrochanteric fracture of right femur, subsequent encounter for closed fracture with routine healing: Secondary | ICD-10-CM | POA: Diagnosis not present

## 2016-03-01 DIAGNOSIS — Z7982 Long term (current) use of aspirin: Secondary | ICD-10-CM | POA: Diagnosis not present

## 2016-03-01 DIAGNOSIS — R29890 Loss of height: Secondary | ICD-10-CM | POA: Diagnosis not present

## 2016-03-01 DIAGNOSIS — E039 Hypothyroidism, unspecified: Secondary | ICD-10-CM | POA: Diagnosis not present

## 2016-03-16 DIAGNOSIS — R262 Difficulty in walking, not elsewhere classified: Secondary | ICD-10-CM | POA: Diagnosis not present

## 2016-03-16 DIAGNOSIS — M6281 Muscle weakness (generalized): Secondary | ICD-10-CM | POA: Diagnosis not present

## 2016-03-16 DIAGNOSIS — S72114D Nondisplaced fracture of greater trochanter of right femur, subsequent encounter for closed fracture with routine healing: Secondary | ICD-10-CM | POA: Diagnosis not present

## 2016-03-16 DIAGNOSIS — M25551 Pain in right hip: Secondary | ICD-10-CM | POA: Diagnosis not present

## 2016-03-19 DIAGNOSIS — R262 Difficulty in walking, not elsewhere classified: Secondary | ICD-10-CM | POA: Diagnosis not present

## 2016-03-19 DIAGNOSIS — M6281 Muscle weakness (generalized): Secondary | ICD-10-CM | POA: Diagnosis not present

## 2016-03-19 DIAGNOSIS — M25551 Pain in right hip: Secondary | ICD-10-CM | POA: Diagnosis not present

## 2016-03-19 DIAGNOSIS — S72114D Nondisplaced fracture of greater trochanter of right femur, subsequent encounter for closed fracture with routine healing: Secondary | ICD-10-CM | POA: Diagnosis not present

## 2016-03-22 DIAGNOSIS — S72114D Nondisplaced fracture of greater trochanter of right femur, subsequent encounter for closed fracture with routine healing: Secondary | ICD-10-CM | POA: Diagnosis not present

## 2016-03-22 DIAGNOSIS — R262 Difficulty in walking, not elsewhere classified: Secondary | ICD-10-CM | POA: Diagnosis not present

## 2016-03-22 DIAGNOSIS — M6281 Muscle weakness (generalized): Secondary | ICD-10-CM | POA: Diagnosis not present

## 2016-03-22 DIAGNOSIS — M25551 Pain in right hip: Secondary | ICD-10-CM | POA: Diagnosis not present

## 2016-03-26 DIAGNOSIS — L570 Actinic keratosis: Secondary | ICD-10-CM | POA: Diagnosis not present

## 2016-03-26 DIAGNOSIS — M25551 Pain in right hip: Secondary | ICD-10-CM | POA: Diagnosis not present

## 2016-03-26 DIAGNOSIS — L853 Xerosis cutis: Secondary | ICD-10-CM | POA: Diagnosis not present

## 2016-03-26 DIAGNOSIS — M6281 Muscle weakness (generalized): Secondary | ICD-10-CM | POA: Diagnosis not present

## 2016-03-26 DIAGNOSIS — R262 Difficulty in walking, not elsewhere classified: Secondary | ICD-10-CM | POA: Diagnosis not present

## 2016-03-26 DIAGNOSIS — S72114D Nondisplaced fracture of greater trochanter of right femur, subsequent encounter for closed fracture with routine healing: Secondary | ICD-10-CM | POA: Diagnosis not present

## 2016-03-26 DIAGNOSIS — Z85828 Personal history of other malignant neoplasm of skin: Secondary | ICD-10-CM | POA: Diagnosis not present

## 2016-03-29 DIAGNOSIS — M25551 Pain in right hip: Secondary | ICD-10-CM | POA: Diagnosis not present

## 2016-03-29 DIAGNOSIS — S72114D Nondisplaced fracture of greater trochanter of right femur, subsequent encounter for closed fracture with routine healing: Secondary | ICD-10-CM | POA: Diagnosis not present

## 2016-03-29 DIAGNOSIS — R262 Difficulty in walking, not elsewhere classified: Secondary | ICD-10-CM | POA: Diagnosis not present

## 2016-03-29 DIAGNOSIS — M6281 Muscle weakness (generalized): Secondary | ICD-10-CM | POA: Diagnosis not present

## 2016-04-02 DIAGNOSIS — S72114D Nondisplaced fracture of greater trochanter of right femur, subsequent encounter for closed fracture with routine healing: Secondary | ICD-10-CM | POA: Diagnosis not present

## 2016-04-02 DIAGNOSIS — M25551 Pain in right hip: Secondary | ICD-10-CM | POA: Diagnosis not present

## 2016-04-02 DIAGNOSIS — M6281 Muscle weakness (generalized): Secondary | ICD-10-CM | POA: Diagnosis not present

## 2016-04-02 DIAGNOSIS — R262 Difficulty in walking, not elsewhere classified: Secondary | ICD-10-CM | POA: Diagnosis not present

## 2016-04-05 DIAGNOSIS — M25551 Pain in right hip: Secondary | ICD-10-CM | POA: Diagnosis not present

## 2016-04-05 DIAGNOSIS — M6281 Muscle weakness (generalized): Secondary | ICD-10-CM | POA: Diagnosis not present

## 2016-04-05 DIAGNOSIS — R262 Difficulty in walking, not elsewhere classified: Secondary | ICD-10-CM | POA: Diagnosis not present

## 2016-04-05 DIAGNOSIS — S72114D Nondisplaced fracture of greater trochanter of right femur, subsequent encounter for closed fracture with routine healing: Secondary | ICD-10-CM | POA: Diagnosis not present

## 2016-04-09 DIAGNOSIS — M25551 Pain in right hip: Secondary | ICD-10-CM | POA: Diagnosis not present

## 2016-04-09 DIAGNOSIS — M25579 Pain in unspecified ankle and joints of unspecified foot: Secondary | ICD-10-CM | POA: Diagnosis not present

## 2016-04-09 DIAGNOSIS — M79671 Pain in right foot: Secondary | ICD-10-CM | POA: Diagnosis not present

## 2016-04-09 DIAGNOSIS — R262 Difficulty in walking, not elsewhere classified: Secondary | ICD-10-CM | POA: Diagnosis not present

## 2016-04-09 DIAGNOSIS — M79672 Pain in left foot: Secondary | ICD-10-CM | POA: Diagnosis not present

## 2016-04-09 DIAGNOSIS — M6281 Muscle weakness (generalized): Secondary | ICD-10-CM | POA: Diagnosis not present

## 2016-04-09 DIAGNOSIS — S72114D Nondisplaced fracture of greater trochanter of right femur, subsequent encounter for closed fracture with routine healing: Secondary | ICD-10-CM | POA: Diagnosis not present

## 2016-04-10 DIAGNOSIS — H401121 Primary open-angle glaucoma, left eye, mild stage: Secondary | ICD-10-CM | POA: Diagnosis not present

## 2016-04-10 DIAGNOSIS — H01025 Squamous blepharitis left lower eyelid: Secondary | ICD-10-CM | POA: Diagnosis not present

## 2016-04-10 DIAGNOSIS — H01022 Squamous blepharitis right lower eyelid: Secondary | ICD-10-CM | POA: Diagnosis not present

## 2016-04-10 DIAGNOSIS — Z961 Presence of intraocular lens: Secondary | ICD-10-CM | POA: Diagnosis not present

## 2016-04-10 DIAGNOSIS — H01024 Squamous blepharitis left upper eyelid: Secondary | ICD-10-CM | POA: Diagnosis not present

## 2016-04-10 DIAGNOSIS — H401421 Capsular glaucoma with pseudoexfoliation of lens, left eye, mild stage: Secondary | ICD-10-CM | POA: Diagnosis not present

## 2016-04-10 DIAGNOSIS — H01021 Squamous blepharitis right upper eyelid: Secondary | ICD-10-CM | POA: Diagnosis not present

## 2016-04-10 DIAGNOSIS — H2589 Other age-related cataract: Secondary | ICD-10-CM | POA: Diagnosis not present

## 2016-04-10 DIAGNOSIS — H40011 Open angle with borderline findings, low risk, right eye: Secondary | ICD-10-CM | POA: Diagnosis not present

## 2016-04-12 DIAGNOSIS — E785 Hyperlipidemia, unspecified: Secondary | ICD-10-CM | POA: Diagnosis not present

## 2016-04-12 DIAGNOSIS — M810 Age-related osteoporosis without current pathological fracture: Secondary | ICD-10-CM | POA: Diagnosis not present

## 2016-04-12 DIAGNOSIS — Z9181 History of falling: Secondary | ICD-10-CM | POA: Diagnosis not present

## 2016-04-12 DIAGNOSIS — Z8262 Family history of osteoporosis: Secondary | ICD-10-CM | POA: Diagnosis not present

## 2016-04-12 DIAGNOSIS — S72114A Nondisplaced fracture of greater trochanter of right femur, initial encounter for closed fracture: Secondary | ICD-10-CM | POA: Diagnosis not present

## 2016-04-12 DIAGNOSIS — E039 Hypothyroidism, unspecified: Secondary | ICD-10-CM | POA: Diagnosis not present

## 2016-04-12 DIAGNOSIS — R29890 Loss of height: Secondary | ICD-10-CM | POA: Diagnosis not present

## 2016-04-12 DIAGNOSIS — S72114D Nondisplaced fracture of greater trochanter of right femur, subsequent encounter for closed fracture with routine healing: Secondary | ICD-10-CM | POA: Diagnosis not present

## 2016-04-13 DIAGNOSIS — M25551 Pain in right hip: Secondary | ICD-10-CM | POA: Diagnosis not present

## 2016-04-13 DIAGNOSIS — S72114D Nondisplaced fracture of greater trochanter of right femur, subsequent encounter for closed fracture with routine healing: Secondary | ICD-10-CM | POA: Diagnosis not present

## 2016-04-13 DIAGNOSIS — R262 Difficulty in walking, not elsewhere classified: Secondary | ICD-10-CM | POA: Diagnosis not present

## 2016-04-13 DIAGNOSIS — M6281 Muscle weakness (generalized): Secondary | ICD-10-CM | POA: Diagnosis not present

## 2016-04-17 DIAGNOSIS — S72114D Nondisplaced fracture of greater trochanter of right femur, subsequent encounter for closed fracture with routine healing: Secondary | ICD-10-CM | POA: Diagnosis not present

## 2016-04-17 DIAGNOSIS — R262 Difficulty in walking, not elsewhere classified: Secondary | ICD-10-CM | POA: Diagnosis not present

## 2016-04-17 DIAGNOSIS — M6281 Muscle weakness (generalized): Secondary | ICD-10-CM | POA: Diagnosis not present

## 2016-04-17 DIAGNOSIS — M25551 Pain in right hip: Secondary | ICD-10-CM | POA: Diagnosis not present

## 2016-04-19 DIAGNOSIS — M6281 Muscle weakness (generalized): Secondary | ICD-10-CM | POA: Diagnosis not present

## 2016-04-19 DIAGNOSIS — S72114D Nondisplaced fracture of greater trochanter of right femur, subsequent encounter for closed fracture with routine healing: Secondary | ICD-10-CM | POA: Diagnosis not present

## 2016-04-19 DIAGNOSIS — M25551 Pain in right hip: Secondary | ICD-10-CM | POA: Diagnosis not present

## 2016-04-19 DIAGNOSIS — R262 Difficulty in walking, not elsewhere classified: Secondary | ICD-10-CM | POA: Diagnosis not present

## 2016-04-23 DIAGNOSIS — M6281 Muscle weakness (generalized): Secondary | ICD-10-CM | POA: Diagnosis not present

## 2016-04-23 DIAGNOSIS — S72114D Nondisplaced fracture of greater trochanter of right femur, subsequent encounter for closed fracture with routine healing: Secondary | ICD-10-CM | POA: Diagnosis not present

## 2016-04-23 DIAGNOSIS — R262 Difficulty in walking, not elsewhere classified: Secondary | ICD-10-CM | POA: Diagnosis not present

## 2016-04-23 DIAGNOSIS — M25551 Pain in right hip: Secondary | ICD-10-CM | POA: Diagnosis not present

## 2016-04-26 DIAGNOSIS — M25551 Pain in right hip: Secondary | ICD-10-CM | POA: Diagnosis not present

## 2016-04-26 DIAGNOSIS — R262 Difficulty in walking, not elsewhere classified: Secondary | ICD-10-CM | POA: Diagnosis not present

## 2016-04-26 DIAGNOSIS — S72114D Nondisplaced fracture of greater trochanter of right femur, subsequent encounter for closed fracture with routine healing: Secondary | ICD-10-CM | POA: Diagnosis not present

## 2016-04-26 DIAGNOSIS — M6281 Muscle weakness (generalized): Secondary | ICD-10-CM | POA: Diagnosis not present

## 2016-05-08 DIAGNOSIS — Z5181 Encounter for therapeutic drug level monitoring: Secondary | ICD-10-CM | POA: Diagnosis not present

## 2016-05-08 DIAGNOSIS — M810 Age-related osteoporosis without current pathological fracture: Secondary | ICD-10-CM | POA: Diagnosis not present

## 2016-05-10 DIAGNOSIS — M810 Age-related osteoporosis without current pathological fracture: Secondary | ICD-10-CM | POA: Diagnosis not present

## 2016-05-11 DIAGNOSIS — E7800 Pure hypercholesterolemia, unspecified: Secondary | ICD-10-CM | POA: Diagnosis not present

## 2016-05-11 DIAGNOSIS — I10 Essential (primary) hypertension: Secondary | ICD-10-CM | POA: Diagnosis not present

## 2016-05-11 DIAGNOSIS — E663 Overweight: Secondary | ICD-10-CM | POA: Diagnosis not present

## 2016-05-11 DIAGNOSIS — J309 Allergic rhinitis, unspecified: Secondary | ICD-10-CM | POA: Diagnosis not present

## 2016-05-11 DIAGNOSIS — Z299 Encounter for prophylactic measures, unspecified: Secondary | ICD-10-CM | POA: Diagnosis not present

## 2016-05-11 DIAGNOSIS — Z789 Other specified health status: Secondary | ICD-10-CM | POA: Diagnosis not present

## 2016-05-25 DIAGNOSIS — Z299 Encounter for prophylactic measures, unspecified: Secondary | ICD-10-CM | POA: Diagnosis not present

## 2016-05-25 DIAGNOSIS — M810 Age-related osteoporosis without current pathological fracture: Secondary | ICD-10-CM | POA: Diagnosis not present

## 2016-05-25 DIAGNOSIS — E7800 Pure hypercholesterolemia, unspecified: Secondary | ICD-10-CM | POA: Diagnosis not present

## 2016-05-25 DIAGNOSIS — E663 Overweight: Secondary | ICD-10-CM | POA: Diagnosis not present

## 2016-05-25 DIAGNOSIS — I10 Essential (primary) hypertension: Secondary | ICD-10-CM | POA: Diagnosis not present

## 2016-06-27 DIAGNOSIS — Z85828 Personal history of other malignant neoplasm of skin: Secondary | ICD-10-CM | POA: Diagnosis not present

## 2016-06-27 DIAGNOSIS — L570 Actinic keratosis: Secondary | ICD-10-CM | POA: Diagnosis not present

## 2016-07-03 DIAGNOSIS — M79671 Pain in right foot: Secondary | ICD-10-CM | POA: Diagnosis not present

## 2016-07-03 DIAGNOSIS — M25579 Pain in unspecified ankle and joints of unspecified foot: Secondary | ICD-10-CM | POA: Diagnosis not present

## 2016-07-03 DIAGNOSIS — M76821 Posterior tibial tendinitis, right leg: Secondary | ICD-10-CM | POA: Diagnosis not present

## 2016-07-16 DIAGNOSIS — M810 Age-related osteoporosis without current pathological fracture: Secondary | ICD-10-CM | POA: Diagnosis not present

## 2016-07-16 DIAGNOSIS — Z789 Other specified health status: Secondary | ICD-10-CM | POA: Diagnosis not present

## 2016-07-16 DIAGNOSIS — J069 Acute upper respiratory infection, unspecified: Secondary | ICD-10-CM | POA: Diagnosis not present

## 2016-07-16 DIAGNOSIS — E7800 Pure hypercholesterolemia, unspecified: Secondary | ICD-10-CM | POA: Diagnosis not present

## 2016-07-16 DIAGNOSIS — Z6827 Body mass index (BMI) 27.0-27.9, adult: Secondary | ICD-10-CM | POA: Diagnosis not present

## 2016-07-16 DIAGNOSIS — Z299 Encounter for prophylactic measures, unspecified: Secondary | ICD-10-CM | POA: Diagnosis not present

## 2016-07-16 DIAGNOSIS — I10 Essential (primary) hypertension: Secondary | ICD-10-CM | POA: Diagnosis not present

## 2016-08-29 DIAGNOSIS — H6980 Other specified disorders of Eustachian tube, unspecified ear: Secondary | ICD-10-CM | POA: Diagnosis not present

## 2016-08-29 DIAGNOSIS — I10 Essential (primary) hypertension: Secondary | ICD-10-CM | POA: Diagnosis not present

## 2016-08-29 DIAGNOSIS — M810 Age-related osteoporosis without current pathological fracture: Secondary | ICD-10-CM | POA: Diagnosis not present

## 2016-08-29 DIAGNOSIS — E663 Overweight: Secondary | ICD-10-CM | POA: Diagnosis not present

## 2016-08-29 DIAGNOSIS — Z299 Encounter for prophylactic measures, unspecified: Secondary | ICD-10-CM | POA: Diagnosis not present

## 2016-08-29 DIAGNOSIS — E7800 Pure hypercholesterolemia, unspecified: Secondary | ICD-10-CM | POA: Diagnosis not present

## 2016-09-12 DIAGNOSIS — E7800 Pure hypercholesterolemia, unspecified: Secondary | ICD-10-CM | POA: Diagnosis not present

## 2016-09-12 DIAGNOSIS — Z299 Encounter for prophylactic measures, unspecified: Secondary | ICD-10-CM | POA: Diagnosis not present

## 2016-09-12 DIAGNOSIS — M810 Age-related osteoporosis without current pathological fracture: Secondary | ICD-10-CM | POA: Diagnosis not present

## 2016-09-12 DIAGNOSIS — I10 Essential (primary) hypertension: Secondary | ICD-10-CM | POA: Diagnosis not present

## 2016-09-12 DIAGNOSIS — Z6828 Body mass index (BMI) 28.0-28.9, adult: Secondary | ICD-10-CM | POA: Diagnosis not present

## 2016-09-12 DIAGNOSIS — H6980 Other specified disorders of Eustachian tube, unspecified ear: Secondary | ICD-10-CM | POA: Diagnosis not present

## 2016-09-25 DIAGNOSIS — M79672 Pain in left foot: Secondary | ICD-10-CM | POA: Diagnosis not present

## 2016-09-25 DIAGNOSIS — M25579 Pain in unspecified ankle and joints of unspecified foot: Secondary | ICD-10-CM | POA: Diagnosis not present

## 2016-10-04 DIAGNOSIS — L821 Other seborrheic keratosis: Secondary | ICD-10-CM | POA: Diagnosis not present

## 2016-10-04 DIAGNOSIS — B078 Other viral warts: Secondary | ICD-10-CM | POA: Diagnosis not present

## 2016-10-04 DIAGNOSIS — Z85828 Personal history of other malignant neoplasm of skin: Secondary | ICD-10-CM | POA: Diagnosis not present

## 2016-10-17 DIAGNOSIS — Z6828 Body mass index (BMI) 28.0-28.9, adult: Secondary | ICD-10-CM | POA: Diagnosis not present

## 2016-10-17 DIAGNOSIS — H6980 Other specified disorders of Eustachian tube, unspecified ear: Secondary | ICD-10-CM | POA: Diagnosis not present

## 2016-10-17 DIAGNOSIS — Z299 Encounter for prophylactic measures, unspecified: Secondary | ICD-10-CM | POA: Diagnosis not present

## 2016-10-17 DIAGNOSIS — Z713 Dietary counseling and surveillance: Secondary | ICD-10-CM | POA: Diagnosis not present

## 2016-10-18 DIAGNOSIS — R0981 Nasal congestion: Secondary | ICD-10-CM | POA: Diagnosis not present

## 2016-10-18 DIAGNOSIS — H6504 Acute serous otitis media, recurrent, right ear: Secondary | ICD-10-CM | POA: Diagnosis not present

## 2016-10-18 DIAGNOSIS — J309 Allergic rhinitis, unspecified: Secondary | ICD-10-CM | POA: Diagnosis not present

## 2016-10-25 DIAGNOSIS — Z1211 Encounter for screening for malignant neoplasm of colon: Secondary | ICD-10-CM | POA: Diagnosis not present

## 2016-10-25 DIAGNOSIS — M810 Age-related osteoporosis without current pathological fracture: Secondary | ICD-10-CM | POA: Diagnosis not present

## 2016-10-25 DIAGNOSIS — R5383 Other fatigue: Secondary | ICD-10-CM | POA: Diagnosis not present

## 2016-10-25 DIAGNOSIS — Z299 Encounter for prophylactic measures, unspecified: Secondary | ICD-10-CM | POA: Diagnosis not present

## 2016-10-25 DIAGNOSIS — Z0000 Encounter for general adult medical examination without abnormal findings: Secondary | ICD-10-CM | POA: Diagnosis not present

## 2016-10-25 DIAGNOSIS — E7800 Pure hypercholesterolemia, unspecified: Secondary | ICD-10-CM | POA: Diagnosis not present

## 2016-10-25 DIAGNOSIS — Z7189 Other specified counseling: Secondary | ICD-10-CM | POA: Diagnosis not present

## 2016-10-25 DIAGNOSIS — Z1389 Encounter for screening for other disorder: Secondary | ICD-10-CM | POA: Diagnosis not present

## 2016-10-25 DIAGNOSIS — Z1231 Encounter for screening mammogram for malignant neoplasm of breast: Secondary | ICD-10-CM | POA: Diagnosis not present

## 2016-10-25 DIAGNOSIS — Z6831 Body mass index (BMI) 31.0-31.9, adult: Secondary | ICD-10-CM | POA: Diagnosis not present

## 2016-10-25 DIAGNOSIS — Z79899 Other long term (current) drug therapy: Secondary | ICD-10-CM | POA: Diagnosis not present

## 2016-11-12 DIAGNOSIS — M810 Age-related osteoporosis without current pathological fracture: Secondary | ICD-10-CM | POA: Diagnosis not present

## 2016-11-12 DIAGNOSIS — Z79899 Other long term (current) drug therapy: Secondary | ICD-10-CM | POA: Diagnosis not present

## 2016-11-30 DIAGNOSIS — H90A12 Conductive hearing loss, unilateral, left ear with restricted hearing on the contralateral side: Secondary | ICD-10-CM | POA: Diagnosis not present

## 2016-11-30 DIAGNOSIS — H6504 Acute serous otitis media, recurrent, right ear: Secondary | ICD-10-CM | POA: Diagnosis not present

## 2016-11-30 DIAGNOSIS — H90A11 Conductive hearing loss, unilateral, right ear with restricted hearing on the contralateral side: Secondary | ICD-10-CM | POA: Diagnosis not present

## 2016-11-30 DIAGNOSIS — R0981 Nasal congestion: Secondary | ICD-10-CM | POA: Diagnosis not present

## 2016-12-19 DIAGNOSIS — H6504 Acute serous otitis media, recurrent, right ear: Secondary | ICD-10-CM | POA: Diagnosis not present

## 2016-12-19 DIAGNOSIS — R0982 Postnasal drip: Secondary | ICD-10-CM | POA: Diagnosis not present

## 2016-12-19 DIAGNOSIS — H6983 Other specified disorders of Eustachian tube, bilateral: Secondary | ICD-10-CM | POA: Diagnosis not present

## 2016-12-20 DIAGNOSIS — Z90710 Acquired absence of both cervix and uterus: Secondary | ICD-10-CM | POA: Diagnosis not present

## 2016-12-20 DIAGNOSIS — E8940 Asymptomatic postprocedural ovarian failure: Secondary | ICD-10-CM | POA: Diagnosis not present

## 2017-01-04 DIAGNOSIS — H6591 Unspecified nonsuppurative otitis media, right ear: Secondary | ICD-10-CM | POA: Diagnosis not present

## 2017-01-04 DIAGNOSIS — H90A31 Mixed conductive and sensorineural hearing loss, unilateral, right ear with restricted hearing on the contralateral side: Secondary | ICD-10-CM | POA: Diagnosis not present

## 2017-01-08 DIAGNOSIS — I10 Essential (primary) hypertension: Secondary | ICD-10-CM | POA: Diagnosis not present

## 2017-01-08 DIAGNOSIS — Z789 Other specified health status: Secondary | ICD-10-CM | POA: Diagnosis not present

## 2017-01-08 DIAGNOSIS — Z299 Encounter for prophylactic measures, unspecified: Secondary | ICD-10-CM | POA: Diagnosis not present

## 2017-01-08 DIAGNOSIS — J329 Chronic sinusitis, unspecified: Secondary | ICD-10-CM | POA: Diagnosis not present

## 2017-01-08 DIAGNOSIS — E7800 Pure hypercholesterolemia, unspecified: Secondary | ICD-10-CM | POA: Diagnosis not present

## 2017-01-08 DIAGNOSIS — Z6831 Body mass index (BMI) 31.0-31.9, adult: Secondary | ICD-10-CM | POA: Diagnosis not present

## 2017-01-11 DIAGNOSIS — J302 Other seasonal allergic rhinitis: Secondary | ICD-10-CM | POA: Diagnosis not present

## 2017-02-04 DIAGNOSIS — Z85828 Personal history of other malignant neoplasm of skin: Secondary | ICD-10-CM | POA: Diagnosis not present

## 2017-02-04 DIAGNOSIS — L720 Epidermal cyst: Secondary | ICD-10-CM | POA: Diagnosis not present

## 2017-02-06 DIAGNOSIS — Z961 Presence of intraocular lens: Secondary | ICD-10-CM | POA: Diagnosis not present

## 2017-02-06 DIAGNOSIS — H04123 Dry eye syndrome of bilateral lacrimal glands: Secondary | ICD-10-CM | POA: Diagnosis not present

## 2017-02-06 DIAGNOSIS — H01022 Squamous blepharitis right lower eyelid: Secondary | ICD-10-CM | POA: Diagnosis not present

## 2017-02-06 DIAGNOSIS — H01024 Squamous blepharitis left upper eyelid: Secondary | ICD-10-CM | POA: Diagnosis not present

## 2017-02-06 DIAGNOSIS — H01021 Squamous blepharitis right upper eyelid: Secondary | ICD-10-CM | POA: Diagnosis not present

## 2017-02-06 DIAGNOSIS — H40011 Open angle with borderline findings, low risk, right eye: Secondary | ICD-10-CM | POA: Diagnosis not present

## 2017-02-06 DIAGNOSIS — H2589 Other age-related cataract: Secondary | ICD-10-CM | POA: Diagnosis not present

## 2017-02-06 DIAGNOSIS — H01025 Squamous blepharitis left lower eyelid: Secondary | ICD-10-CM | POA: Diagnosis not present

## 2017-02-06 DIAGNOSIS — H401422 Capsular glaucoma with pseudoexfoliation of lens, left eye, moderate stage: Secondary | ICD-10-CM | POA: Diagnosis not present

## 2017-02-11 DIAGNOSIS — M25579 Pain in unspecified ankle and joints of unspecified foot: Secondary | ICD-10-CM | POA: Diagnosis not present

## 2017-02-11 DIAGNOSIS — M79671 Pain in right foot: Secondary | ICD-10-CM | POA: Diagnosis not present

## 2017-02-11 DIAGNOSIS — Z23 Encounter for immunization: Secondary | ICD-10-CM | POA: Diagnosis not present

## 2017-03-12 DIAGNOSIS — H6592 Unspecified nonsuppurative otitis media, left ear: Secondary | ICD-10-CM | POA: Diagnosis not present

## 2017-05-08 DIAGNOSIS — Z299 Encounter for prophylactic measures, unspecified: Secondary | ICD-10-CM | POA: Diagnosis not present

## 2017-05-08 DIAGNOSIS — K219 Gastro-esophageal reflux disease without esophagitis: Secondary | ICD-10-CM | POA: Diagnosis not present

## 2017-05-08 DIAGNOSIS — Z6832 Body mass index (BMI) 32.0-32.9, adult: Secondary | ICD-10-CM | POA: Diagnosis not present

## 2017-05-08 DIAGNOSIS — Z713 Dietary counseling and surveillance: Secondary | ICD-10-CM | POA: Diagnosis not present

## 2017-05-08 DIAGNOSIS — I10 Essential (primary) hypertension: Secondary | ICD-10-CM | POA: Diagnosis not present

## 2017-05-13 DIAGNOSIS — Z9181 History of falling: Secondary | ICD-10-CM | POA: Diagnosis not present

## 2017-05-13 DIAGNOSIS — E039 Hypothyroidism, unspecified: Secondary | ICD-10-CM | POA: Diagnosis not present

## 2017-05-13 DIAGNOSIS — M810 Age-related osteoporosis without current pathological fracture: Secondary | ICD-10-CM | POA: Diagnosis not present

## 2017-05-13 DIAGNOSIS — R29890 Loss of height: Secondary | ICD-10-CM | POA: Diagnosis not present

## 2017-05-13 DIAGNOSIS — Z8262 Family history of osteoporosis: Secondary | ICD-10-CM | POA: Diagnosis not present

## 2017-05-29 DIAGNOSIS — K219 Gastro-esophageal reflux disease without esophagitis: Secondary | ICD-10-CM | POA: Diagnosis not present

## 2017-05-29 DIAGNOSIS — I10 Essential (primary) hypertension: Secondary | ICD-10-CM | POA: Diagnosis not present

## 2017-05-29 DIAGNOSIS — Z789 Other specified health status: Secondary | ICD-10-CM | POA: Diagnosis not present

## 2017-05-29 DIAGNOSIS — Z713 Dietary counseling and surveillance: Secondary | ICD-10-CM | POA: Diagnosis not present

## 2017-05-29 DIAGNOSIS — Z299 Encounter for prophylactic measures, unspecified: Secondary | ICD-10-CM | POA: Diagnosis not present

## 2017-05-29 DIAGNOSIS — Z6832 Body mass index (BMI) 32.0-32.9, adult: Secondary | ICD-10-CM | POA: Diagnosis not present

## 2017-06-12 DIAGNOSIS — M79671 Pain in right foot: Secondary | ICD-10-CM | POA: Diagnosis not present

## 2017-06-12 DIAGNOSIS — M25579 Pain in unspecified ankle and joints of unspecified foot: Secondary | ICD-10-CM | POA: Diagnosis not present

## 2017-06-12 DIAGNOSIS — Z299 Encounter for prophylactic measures, unspecified: Secondary | ICD-10-CM | POA: Diagnosis not present

## 2017-06-12 DIAGNOSIS — Z6831 Body mass index (BMI) 31.0-31.9, adult: Secondary | ICD-10-CM | POA: Diagnosis not present

## 2017-06-12 DIAGNOSIS — M76821 Posterior tibial tendinitis, right leg: Secondary | ICD-10-CM | POA: Diagnosis not present

## 2017-06-12 DIAGNOSIS — Z713 Dietary counseling and surveillance: Secondary | ICD-10-CM | POA: Diagnosis not present

## 2017-06-12 DIAGNOSIS — I10 Essential (primary) hypertension: Secondary | ICD-10-CM | POA: Diagnosis not present

## 2017-07-09 DIAGNOSIS — Z6831 Body mass index (BMI) 31.0-31.9, adult: Secondary | ICD-10-CM | POA: Diagnosis not present

## 2017-07-09 DIAGNOSIS — M2140 Flat foot [pes planus] (acquired), unspecified foot: Secondary | ICD-10-CM | POA: Diagnosis not present

## 2017-07-09 DIAGNOSIS — M79671 Pain in right foot: Secondary | ICD-10-CM | POA: Diagnosis not present

## 2017-07-09 DIAGNOSIS — I10 Essential (primary) hypertension: Secondary | ICD-10-CM | POA: Diagnosis not present

## 2017-07-09 DIAGNOSIS — Z299 Encounter for prophylactic measures, unspecified: Secondary | ICD-10-CM | POA: Diagnosis not present

## 2017-07-12 DIAGNOSIS — M2141 Flat foot [pes planus] (acquired), right foot: Secondary | ICD-10-CM | POA: Diagnosis not present

## 2017-08-06 DIAGNOSIS — Z6831 Body mass index (BMI) 31.0-31.9, adult: Secondary | ICD-10-CM | POA: Diagnosis not present

## 2017-08-06 DIAGNOSIS — I10 Essential (primary) hypertension: Secondary | ICD-10-CM | POA: Diagnosis not present

## 2017-08-06 DIAGNOSIS — Z713 Dietary counseling and surveillance: Secondary | ICD-10-CM | POA: Diagnosis not present

## 2017-08-06 DIAGNOSIS — Z299 Encounter for prophylactic measures, unspecified: Secondary | ICD-10-CM | POA: Diagnosis not present

## 2017-08-06 DIAGNOSIS — E7800 Pure hypercholesterolemia, unspecified: Secondary | ICD-10-CM | POA: Diagnosis not present

## 2017-08-26 DIAGNOSIS — M67961 Unspecified disorder of synovium and tendon, right lower leg: Secondary | ICD-10-CM | POA: Diagnosis not present

## 2017-08-27 DIAGNOSIS — M25571 Pain in right ankle and joints of right foot: Secondary | ICD-10-CM | POA: Diagnosis not present

## 2017-08-29 DIAGNOSIS — M25571 Pain in right ankle and joints of right foot: Secondary | ICD-10-CM | POA: Diagnosis not present

## 2017-09-02 DIAGNOSIS — M25571 Pain in right ankle and joints of right foot: Secondary | ICD-10-CM | POA: Diagnosis not present

## 2017-09-05 DIAGNOSIS — M25571 Pain in right ankle and joints of right foot: Secondary | ICD-10-CM | POA: Diagnosis not present

## 2017-09-06 DIAGNOSIS — M25571 Pain in right ankle and joints of right foot: Secondary | ICD-10-CM | POA: Diagnosis not present

## 2017-09-09 DIAGNOSIS — M25571 Pain in right ankle and joints of right foot: Secondary | ICD-10-CM | POA: Diagnosis not present

## 2017-09-11 DIAGNOSIS — M25571 Pain in right ankle and joints of right foot: Secondary | ICD-10-CM | POA: Diagnosis not present

## 2017-09-13 DIAGNOSIS — M25571 Pain in right ankle and joints of right foot: Secondary | ICD-10-CM | POA: Diagnosis not present

## 2017-09-17 DIAGNOSIS — M25571 Pain in right ankle and joints of right foot: Secondary | ICD-10-CM | POA: Diagnosis not present

## 2017-09-18 DIAGNOSIS — M25571 Pain in right ankle and joints of right foot: Secondary | ICD-10-CM | POA: Diagnosis not present

## 2017-09-20 DIAGNOSIS — M25571 Pain in right ankle and joints of right foot: Secondary | ICD-10-CM | POA: Diagnosis not present

## 2017-09-25 DIAGNOSIS — M25571 Pain in right ankle and joints of right foot: Secondary | ICD-10-CM | POA: Diagnosis not present

## 2017-09-27 DIAGNOSIS — M25571 Pain in right ankle and joints of right foot: Secondary | ICD-10-CM | POA: Diagnosis not present

## 2017-10-01 DIAGNOSIS — M25571 Pain in right ankle and joints of right foot: Secondary | ICD-10-CM | POA: Diagnosis not present

## 2017-10-03 DIAGNOSIS — M25571 Pain in right ankle and joints of right foot: Secondary | ICD-10-CM | POA: Diagnosis not present

## 2017-10-09 DIAGNOSIS — M67961 Unspecified disorder of synovium and tendon, right lower leg: Secondary | ICD-10-CM | POA: Diagnosis not present

## 2017-10-17 DIAGNOSIS — M67961 Unspecified disorder of synovium and tendon, right lower leg: Secondary | ICD-10-CM | POA: Diagnosis not present

## 2017-10-17 DIAGNOSIS — M67969 Unspecified disorder of synovium and tendon, unspecified lower leg: Secondary | ICD-10-CM | POA: Diagnosis not present

## 2017-10-21 DIAGNOSIS — M25371 Other instability, right ankle: Secondary | ICD-10-CM | POA: Diagnosis not present

## 2017-10-21 DIAGNOSIS — M67961 Unspecified disorder of synovium and tendon, right lower leg: Secondary | ICD-10-CM | POA: Diagnosis not present

## 2017-10-21 DIAGNOSIS — M84361A Stress fracture, right tibia, initial encounter for fracture: Secondary | ICD-10-CM | POA: Diagnosis not present

## 2017-10-31 DIAGNOSIS — Z1211 Encounter for screening for malignant neoplasm of colon: Secondary | ICD-10-CM | POA: Diagnosis not present

## 2017-10-31 DIAGNOSIS — I10 Essential (primary) hypertension: Secondary | ICD-10-CM | POA: Diagnosis not present

## 2017-10-31 DIAGNOSIS — Z1339 Encounter for screening examination for other mental health and behavioral disorders: Secondary | ICD-10-CM | POA: Diagnosis not present

## 2017-10-31 DIAGNOSIS — Z1331 Encounter for screening for depression: Secondary | ICD-10-CM | POA: Diagnosis not present

## 2017-10-31 DIAGNOSIS — Z0000 Encounter for general adult medical examination without abnormal findings: Secondary | ICD-10-CM | POA: Diagnosis not present

## 2017-10-31 DIAGNOSIS — R5383 Other fatigue: Secondary | ICD-10-CM | POA: Diagnosis not present

## 2017-10-31 DIAGNOSIS — M810 Age-related osteoporosis without current pathological fracture: Secondary | ICD-10-CM | POA: Diagnosis not present

## 2017-10-31 DIAGNOSIS — Z23 Encounter for immunization: Secondary | ICD-10-CM | POA: Diagnosis not present

## 2017-10-31 DIAGNOSIS — Z79899 Other long term (current) drug therapy: Secondary | ICD-10-CM | POA: Diagnosis not present

## 2017-10-31 DIAGNOSIS — Z7189 Other specified counseling: Secondary | ICD-10-CM | POA: Diagnosis not present

## 2017-10-31 DIAGNOSIS — Z6830 Body mass index (BMI) 30.0-30.9, adult: Secondary | ICD-10-CM | POA: Diagnosis not present

## 2017-10-31 DIAGNOSIS — E7800 Pure hypercholesterolemia, unspecified: Secondary | ICD-10-CM | POA: Diagnosis not present

## 2017-10-31 DIAGNOSIS — Z299 Encounter for prophylactic measures, unspecified: Secondary | ICD-10-CM | POA: Diagnosis not present

## 2017-11-01 DIAGNOSIS — Z1231 Encounter for screening mammogram for malignant neoplasm of breast: Secondary | ICD-10-CM | POA: Diagnosis not present

## 2017-11-14 DIAGNOSIS — Z79899 Other long term (current) drug therapy: Secondary | ICD-10-CM | POA: Diagnosis not present

## 2017-11-14 DIAGNOSIS — Z5181 Encounter for therapeutic drug level monitoring: Secondary | ICD-10-CM | POA: Diagnosis not present

## 2017-11-14 DIAGNOSIS — M810 Age-related osteoporosis without current pathological fracture: Secondary | ICD-10-CM | POA: Diagnosis not present

## 2017-11-15 DIAGNOSIS — Z87442 Personal history of urinary calculi: Secondary | ICD-10-CM | POA: Diagnosis not present

## 2017-11-19 DIAGNOSIS — M79672 Pain in left foot: Secondary | ICD-10-CM | POA: Diagnosis not present

## 2017-11-19 DIAGNOSIS — M79671 Pain in right foot: Secondary | ICD-10-CM | POA: Diagnosis not present

## 2017-11-19 DIAGNOSIS — M25579 Pain in unspecified ankle and joints of unspecified foot: Secondary | ICD-10-CM | POA: Diagnosis not present

## 2017-11-19 DIAGNOSIS — I739 Peripheral vascular disease, unspecified: Secondary | ICD-10-CM | POA: Diagnosis not present

## 2017-11-25 DIAGNOSIS — M2141 Flat foot [pes planus] (acquired), right foot: Secondary | ICD-10-CM | POA: Diagnosis not present

## 2017-11-25 DIAGNOSIS — M25571 Pain in right ankle and joints of right foot: Secondary | ICD-10-CM | POA: Diagnosis not present

## 2017-11-25 DIAGNOSIS — M21071 Valgus deformity, not elsewhere classified, right ankle: Secondary | ICD-10-CM | POA: Diagnosis not present

## 2017-11-25 DIAGNOSIS — M76821 Posterior tibial tendinitis, right leg: Secondary | ICD-10-CM | POA: Diagnosis not present

## 2018-01-16 DIAGNOSIS — Z6831 Body mass index (BMI) 31.0-31.9, adult: Secondary | ICD-10-CM | POA: Diagnosis not present

## 2018-01-16 DIAGNOSIS — I10 Essential (primary) hypertension: Secondary | ICD-10-CM | POA: Diagnosis not present

## 2018-01-16 DIAGNOSIS — L299 Pruritus, unspecified: Secondary | ICD-10-CM | POA: Diagnosis not present

## 2018-01-16 DIAGNOSIS — Z299 Encounter for prophylactic measures, unspecified: Secondary | ICD-10-CM | POA: Diagnosis not present

## 2018-01-16 DIAGNOSIS — E7800 Pure hypercholesterolemia, unspecified: Secondary | ICD-10-CM | POA: Diagnosis not present

## 2018-02-03 DIAGNOSIS — L814 Other melanin hyperpigmentation: Secondary | ICD-10-CM | POA: Diagnosis not present

## 2018-02-03 DIAGNOSIS — L570 Actinic keratosis: Secondary | ICD-10-CM | POA: Diagnosis not present

## 2018-02-03 DIAGNOSIS — D1801 Hemangioma of skin and subcutaneous tissue: Secondary | ICD-10-CM | POA: Diagnosis not present

## 2018-02-03 DIAGNOSIS — L738 Other specified follicular disorders: Secondary | ICD-10-CM | POA: Diagnosis not present

## 2018-02-03 DIAGNOSIS — C4441 Basal cell carcinoma of skin of scalp and neck: Secondary | ICD-10-CM | POA: Diagnosis not present

## 2018-02-03 DIAGNOSIS — L821 Other seborrheic keratosis: Secondary | ICD-10-CM | POA: Diagnosis not present

## 2018-02-03 DIAGNOSIS — D485 Neoplasm of uncertain behavior of skin: Secondary | ICD-10-CM | POA: Diagnosis not present

## 2018-02-06 DIAGNOSIS — H401422 Capsular glaucoma with pseudoexfoliation of lens, left eye, moderate stage: Secondary | ICD-10-CM | POA: Diagnosis not present

## 2018-02-06 DIAGNOSIS — H01024 Squamous blepharitis left upper eyelid: Secondary | ICD-10-CM | POA: Diagnosis not present

## 2018-02-06 DIAGNOSIS — H01022 Squamous blepharitis right lower eyelid: Secondary | ICD-10-CM | POA: Diagnosis not present

## 2018-02-06 DIAGNOSIS — H40011 Open angle with borderline findings, low risk, right eye: Secondary | ICD-10-CM | POA: Diagnosis not present

## 2018-02-06 DIAGNOSIS — H01021 Squamous blepharitis right upper eyelid: Secondary | ICD-10-CM | POA: Diagnosis not present

## 2018-02-06 DIAGNOSIS — H04123 Dry eye syndrome of bilateral lacrimal glands: Secondary | ICD-10-CM | POA: Diagnosis not present

## 2018-02-06 DIAGNOSIS — Z961 Presence of intraocular lens: Secondary | ICD-10-CM | POA: Diagnosis not present

## 2018-02-06 DIAGNOSIS — H01025 Squamous blepharitis left lower eyelid: Secondary | ICD-10-CM | POA: Diagnosis not present

## 2018-02-12 DIAGNOSIS — M1712 Unilateral primary osteoarthritis, left knee: Secondary | ICD-10-CM | POA: Diagnosis not present

## 2018-02-18 DIAGNOSIS — M25579 Pain in unspecified ankle and joints of unspecified foot: Secondary | ICD-10-CM | POA: Diagnosis not present

## 2018-02-18 DIAGNOSIS — M79671 Pain in right foot: Secondary | ICD-10-CM | POA: Diagnosis not present

## 2018-02-18 DIAGNOSIS — I739 Peripheral vascular disease, unspecified: Secondary | ICD-10-CM | POA: Diagnosis not present

## 2018-02-18 DIAGNOSIS — M79672 Pain in left foot: Secondary | ICD-10-CM | POA: Diagnosis not present

## 2018-03-03 DIAGNOSIS — C4441 Basal cell carcinoma of skin of scalp and neck: Secondary | ICD-10-CM | POA: Diagnosis not present

## 2018-03-03 DIAGNOSIS — L905 Scar conditions and fibrosis of skin: Secondary | ICD-10-CM | POA: Diagnosis not present

## 2018-04-17 DIAGNOSIS — E7800 Pure hypercholesterolemia, unspecified: Secondary | ICD-10-CM | POA: Diagnosis not present

## 2018-04-17 DIAGNOSIS — Z789 Other specified health status: Secondary | ICD-10-CM | POA: Diagnosis not present

## 2018-04-17 DIAGNOSIS — M810 Age-related osteoporosis without current pathological fracture: Secondary | ICD-10-CM | POA: Diagnosis not present

## 2018-04-17 DIAGNOSIS — Z299 Encounter for prophylactic measures, unspecified: Secondary | ICD-10-CM | POA: Diagnosis not present

## 2018-04-17 DIAGNOSIS — I10 Essential (primary) hypertension: Secondary | ICD-10-CM | POA: Diagnosis not present

## 2018-04-17 DIAGNOSIS — Z6831 Body mass index (BMI) 31.0-31.9, adult: Secondary | ICD-10-CM | POA: Diagnosis not present

## 2018-04-22 DIAGNOSIS — Z79899 Other long term (current) drug therapy: Secondary | ICD-10-CM | POA: Diagnosis not present

## 2018-04-22 DIAGNOSIS — M818 Other osteoporosis without current pathological fracture: Secondary | ICD-10-CM | POA: Diagnosis not present

## 2018-06-10 DIAGNOSIS — M79672 Pain in left foot: Secondary | ICD-10-CM | POA: Diagnosis not present

## 2018-06-10 DIAGNOSIS — I739 Peripheral vascular disease, unspecified: Secondary | ICD-10-CM | POA: Diagnosis not present

## 2018-06-10 DIAGNOSIS — M25579 Pain in unspecified ankle and joints of unspecified foot: Secondary | ICD-10-CM | POA: Diagnosis not present

## 2018-06-10 DIAGNOSIS — M79671 Pain in right foot: Secondary | ICD-10-CM | POA: Diagnosis not present

## 2018-06-16 DIAGNOSIS — M810 Age-related osteoporosis without current pathological fracture: Secondary | ICD-10-CM | POA: Diagnosis not present

## 2018-06-16 DIAGNOSIS — R29890 Loss of height: Secondary | ICD-10-CM | POA: Diagnosis not present

## 2018-06-16 DIAGNOSIS — E039 Hypothyroidism, unspecified: Secondary | ICD-10-CM | POA: Diagnosis not present

## 2018-06-16 DIAGNOSIS — Z8262 Family history of osteoporosis: Secondary | ICD-10-CM | POA: Diagnosis not present

## 2018-06-16 DIAGNOSIS — Z9181 History of falling: Secondary | ICD-10-CM | POA: Diagnosis not present

## 2018-07-22 DIAGNOSIS — Z713 Dietary counseling and surveillance: Secondary | ICD-10-CM | POA: Diagnosis not present

## 2018-07-22 DIAGNOSIS — Z6830 Body mass index (BMI) 30.0-30.9, adult: Secondary | ICD-10-CM | POA: Diagnosis not present

## 2018-07-22 DIAGNOSIS — Z299 Encounter for prophylactic measures, unspecified: Secondary | ICD-10-CM | POA: Diagnosis not present

## 2018-07-22 DIAGNOSIS — I10 Essential (primary) hypertension: Secondary | ICD-10-CM | POA: Diagnosis not present

## 2018-08-14 DIAGNOSIS — Z6830 Body mass index (BMI) 30.0-30.9, adult: Secondary | ICD-10-CM | POA: Diagnosis not present

## 2018-08-14 DIAGNOSIS — Z299 Encounter for prophylactic measures, unspecified: Secondary | ICD-10-CM | POA: Diagnosis not present

## 2018-08-14 DIAGNOSIS — R1310 Dysphagia, unspecified: Secondary | ICD-10-CM | POA: Diagnosis not present

## 2018-08-14 DIAGNOSIS — Z713 Dietary counseling and surveillance: Secondary | ICD-10-CM | POA: Diagnosis not present

## 2018-08-14 DIAGNOSIS — I10 Essential (primary) hypertension: Secondary | ICD-10-CM | POA: Diagnosis not present

## 2018-08-19 DIAGNOSIS — M1712 Unilateral primary osteoarthritis, left knee: Secondary | ICD-10-CM | POA: Diagnosis not present

## 2018-08-21 ENCOUNTER — Other Ambulatory Visit: Payer: Self-pay

## 2018-08-21 ENCOUNTER — Ambulatory Visit (INDEPENDENT_AMBULATORY_CARE_PROVIDER_SITE_OTHER): Payer: Medicare Other | Admitting: Internal Medicine

## 2018-08-21 ENCOUNTER — Encounter (INDEPENDENT_AMBULATORY_CARE_PROVIDER_SITE_OTHER): Payer: Self-pay | Admitting: Internal Medicine

## 2018-08-21 VITALS — BP 169/88 | HR 67 | Temp 98.1°F | Ht 60.0 in | Wt 160.7 lb

## 2018-08-21 DIAGNOSIS — M542 Cervicalgia: Secondary | ICD-10-CM

## 2018-08-21 DIAGNOSIS — R1310 Dysphagia, unspecified: Secondary | ICD-10-CM | POA: Diagnosis not present

## 2018-08-21 DIAGNOSIS — R1319 Other dysphagia: Secondary | ICD-10-CM

## 2018-08-21 NOTE — Patient Instructions (Signed)
US soft tissue of neck.

## 2018-08-21 NOTE — Progress Notes (Signed)
Subjective:    Patient ID: Robin Chambers, female    DOB: 09-Dec-1940, 78 y.o.   MRN: [id number]  HPI Referred by Dr. Woody Seller for dysphagia. She says when she swallows, her rt side of her neck bothers her. Symptoms for about a month. She thinks she can see something in her throat. Foods are not lodging in her esophagus. Feels a fullness in her esophagus when she swallows water.  She does have a funny feeling in her esophagus after she eats. Appetite is okay. Weight loss 5-6 pounds since weather has become hot. BMs move okay. No melena or BRRB.    Review of Systems Past Medical History:  Diagnosis Date  . Double vision    left eye  . High cholesterol   . PONV (postoperative nausea and vomiting)     Past Surgical History:  Procedure Laterality Date  . ABDOMINAL HYSTERECTOMY    . BREAST SURGERY     x 2 for fibroids.  . cataract surgery    . COLONOSCOPY N/A 01/26/2015   Procedure: COLONOSCOPY;  Surgeon: Rogene Houston, MD;  Location: [facility];  Service: Endoscopy;  Laterality: N/A;  10:00am-rescheduled to 12/21 @ 12:55pm Ann notified pt  . complete hypsterectomy     tumor that burst  . THYROID SURGERY     for nodules.    No Known Allergies  Current Outpatient Medications on File Prior to Visit  Medication Sig Dispense Refill  . acetaminophen (TYLENOL) 500 MG tablet Take 500 mg by mouth every 4 (four) hours as needed for moderate pain or fever.    Marland Kitchen aspirin 81 MG tablet Take 81 mg by mouth daily.    . Calcium Carbonate-Vit D-Min (CALCIUM 1200 PO) Take 1 tablet by mouth daily.    . cetirizine (ZYRTEC) 10 MG tablet Take 10 mg by mouth daily.    . Cholecalciferol (VITAMIN D3) 20 MCG (800 UNIT) TABS Take 1,000 Units by mouth.    . denosumab (PROLIA) 60 MG/ML SOSY injection Inject 60 mg into the skin every 6 (six) months.    . docusate sodium (COLACE) 100 MG capsule Take 200 mg by mouth daily.     . fluticasone (FLONASE) 50 MCG/ACT nasal spray Place 2 sprays into both nostrils  daily.     Marland Kitchen lisinopril (ZESTRIL) 10 MG tablet Take 10 mg by mouth daily.    . montelukast (SINGULAIR) 10 MG tablet Take 10 mg by mouth at bedtime.    . Multiple Vitamin (MULTIVITAMIN WITH MINERALS) TABS tablet Take 1 tablet by mouth daily.    . simvastatin (ZOCOR) 20 MG tablet Take 20 mg by mouth daily.    . timolol (BETIMOL) 0.5 % ophthalmic solution Place 1 drop into the left eye daily.    . timolol (BETIMOL) 0.5 % ophthalmic solution Place 1 drop into the left eye daily.    Marland Kitchen tretinoin (RETIN-A) 0.1 % cream Apply 1 application topically at bedtime.     . Vitamin D, Ergocalciferol, (DRISDOL) 50000 UNITS CAPS capsule Take 1 capsule by mouth once a week.     No current facility-administered medications on file prior to visit.         Objective:   Physical Exam Blood pressure (!) 169/88, pulse 67, temperature 98.1 F (36.7 C), height 5' (1.524 m), weight 160 lb 11.2 oz (72.9 kg).  Alert and oriented. Skin warm and dry. Oral mucosa is moist.   . Sclera anicteric, conjunctivae is pink. Thyroid not enlarged. No cervical lymphadenopathy. Lungs  clear. Heart regular rate and rhythm.  Abdomen is soft. Bowel sounds are positive. No hepatomegaly. No abdominal masses felt. No tenderness.  No edema to lower extremities.        Assessment & Plan:  Dysphagia. US neck soft tissue  Further recommendations to follow.

## 2018-08-28 ENCOUNTER — Other Ambulatory Visit: Payer: Self-pay

## 2018-08-28 ENCOUNTER — Ambulatory Visit (HOSPITAL_COMMUNITY)
Admission: RE | Admit: 2018-08-28 | Discharge: 2018-08-28 | Disposition: A | Discharge status: Home / Self Care | Payer: Medicare Other | Source: Ambulatory Visit | Attending: Internal Medicine | Admitting: Internal Medicine

## 2018-08-28 DIAGNOSIS — R1319 Other dysphagia: Secondary | ICD-10-CM

## 2018-08-28 DIAGNOSIS — M542 Cervicalgia: Secondary | ICD-10-CM | POA: Diagnosis not present

## 2018-08-28 DIAGNOSIS — R1310 Dysphagia, unspecified: Secondary | ICD-10-CM | POA: Diagnosis not present

## 2018-09-02 DIAGNOSIS — M1712 Unilateral primary osteoarthritis, left knee: Secondary | ICD-10-CM | POA: Diagnosis not present

## 2018-09-09 DIAGNOSIS — M79671 Pain in right foot: Secondary | ICD-10-CM | POA: Diagnosis not present

## 2018-09-09 DIAGNOSIS — M79672 Pain in left foot: Secondary | ICD-10-CM | POA: Diagnosis not present

## 2018-09-09 DIAGNOSIS — I739 Peripheral vascular disease, unspecified: Secondary | ICD-10-CM | POA: Diagnosis not present

## 2018-09-09 DIAGNOSIS — M25579 Pain in unspecified ankle and joints of unspecified foot: Secondary | ICD-10-CM | POA: Diagnosis not present

## 2018-09-12 DIAGNOSIS — K219 Gastro-esophageal reflux disease without esophagitis: Secondary | ICD-10-CM | POA: Diagnosis not present

## 2018-10-08 DIAGNOSIS — Z6829 Body mass index (BMI) 29.0-29.9, adult: Secondary | ICD-10-CM | POA: Diagnosis not present

## 2018-10-08 DIAGNOSIS — R21 Rash and other nonspecific skin eruption: Secondary | ICD-10-CM | POA: Diagnosis not present

## 2018-10-08 DIAGNOSIS — I10 Essential (primary) hypertension: Secondary | ICD-10-CM | POA: Diagnosis not present

## 2018-10-08 DIAGNOSIS — W548XXA Other contact with dog, initial encounter: Secondary | ICD-10-CM | POA: Diagnosis not present

## 2018-10-08 DIAGNOSIS — Z299 Encounter for prophylactic measures, unspecified: Secondary | ICD-10-CM | POA: Diagnosis not present

## 2018-10-23 DIAGNOSIS — Z6829 Body mass index (BMI) 29.0-29.9, adult: Secondary | ICD-10-CM | POA: Diagnosis not present

## 2018-10-23 DIAGNOSIS — I10 Essential (primary) hypertension: Secondary | ICD-10-CM | POA: Diagnosis not present

## 2018-10-23 DIAGNOSIS — Z299 Encounter for prophylactic measures, unspecified: Secondary | ICD-10-CM | POA: Diagnosis not present

## 2018-10-23 DIAGNOSIS — Z23 Encounter for immunization: Secondary | ICD-10-CM | POA: Diagnosis not present

## 2018-10-23 DIAGNOSIS — Z713 Dietary counseling and surveillance: Secondary | ICD-10-CM | POA: Diagnosis not present

## 2018-11-06 DIAGNOSIS — Z1331 Encounter for screening for depression: Secondary | ICD-10-CM | POA: Diagnosis not present

## 2018-11-06 DIAGNOSIS — I10 Essential (primary) hypertension: Secondary | ICD-10-CM | POA: Diagnosis not present

## 2018-11-06 DIAGNOSIS — Z1339 Encounter for screening examination for other mental health and behavioral disorders: Secondary | ICD-10-CM | POA: Diagnosis not present

## 2018-11-06 DIAGNOSIS — Z79899 Other long term (current) drug therapy: Secondary | ICD-10-CM | POA: Diagnosis not present

## 2018-11-06 DIAGNOSIS — Z1211 Encounter for screening for malignant neoplasm of colon: Secondary | ICD-10-CM | POA: Diagnosis not present

## 2018-11-06 DIAGNOSIS — E7800 Pure hypercholesterolemia, unspecified: Secondary | ICD-10-CM | POA: Diagnosis not present

## 2018-11-06 DIAGNOSIS — Z7189 Other specified counseling: Secondary | ICD-10-CM | POA: Diagnosis not present

## 2018-11-06 DIAGNOSIS — Z299 Encounter for prophylactic measures, unspecified: Secondary | ICD-10-CM | POA: Diagnosis not present

## 2018-11-06 DIAGNOSIS — Z0000 Encounter for general adult medical examination without abnormal findings: Secondary | ICD-10-CM | POA: Diagnosis not present

## 2018-11-06 DIAGNOSIS — Z6829 Body mass index (BMI) 29.0-29.9, adult: Secondary | ICD-10-CM | POA: Diagnosis not present

## 2018-11-06 DIAGNOSIS — R5383 Other fatigue: Secondary | ICD-10-CM | POA: Diagnosis not present

## 2018-11-12 DIAGNOSIS — Z01812 Encounter for preprocedural laboratory examination: Secondary | ICD-10-CM | POA: Diagnosis not present

## 2018-11-12 DIAGNOSIS — I10 Essential (primary) hypertension: Secondary | ICD-10-CM | POA: Diagnosis not present

## 2018-11-12 DIAGNOSIS — R9431 Abnormal electrocardiogram [ECG] [EKG]: Secondary | ICD-10-CM | POA: Diagnosis not present

## 2018-11-12 DIAGNOSIS — Z01810 Encounter for preprocedural cardiovascular examination: Secondary | ICD-10-CM | POA: Diagnosis not present

## 2018-11-12 DIAGNOSIS — I471 Supraventricular tachycardia: Secondary | ICD-10-CM | POA: Diagnosis not present

## 2018-11-12 DIAGNOSIS — M1712 Unilateral primary osteoarthritis, left knee: Secondary | ICD-10-CM | POA: Diagnosis not present

## 2018-11-15 DIAGNOSIS — Z01818 Encounter for other preprocedural examination: Secondary | ICD-10-CM | POA: Diagnosis not present

## 2018-11-17 DIAGNOSIS — M170 Bilateral primary osteoarthritis of knee: Secondary | ICD-10-CM | POA: Diagnosis not present

## 2018-11-17 DIAGNOSIS — M1712 Unilateral primary osteoarthritis, left knee: Secondary | ICD-10-CM | POA: Diagnosis not present

## 2018-11-20 DIAGNOSIS — M1712 Unilateral primary osteoarthritis, left knee: Secondary | ICD-10-CM | POA: Diagnosis not present

## 2018-11-20 DIAGNOSIS — Z96652 Presence of left artificial knee joint: Secondary | ICD-10-CM | POA: Diagnosis not present

## 2018-11-20 DIAGNOSIS — I471 Supraventricular tachycardia: Secondary | ICD-10-CM | POA: Diagnosis not present

## 2018-11-20 DIAGNOSIS — E7800 Pure hypercholesterolemia, unspecified: Secondary | ICD-10-CM | POA: Diagnosis not present

## 2018-11-20 DIAGNOSIS — G8918 Other acute postprocedural pain: Secondary | ICD-10-CM | POA: Diagnosis not present

## 2018-11-20 DIAGNOSIS — K219 Gastro-esophageal reflux disease without esophagitis: Secondary | ICD-10-CM | POA: Diagnosis not present

## 2018-11-20 DIAGNOSIS — Z79899 Other long term (current) drug therapy: Secondary | ICD-10-CM | POA: Diagnosis not present

## 2018-11-20 DIAGNOSIS — Z85828 Personal history of other malignant neoplasm of skin: Secondary | ICD-10-CM | POA: Diagnosis not present

## 2018-11-20 DIAGNOSIS — I10 Essential (primary) hypertension: Secondary | ICD-10-CM | POA: Diagnosis not present

## 2018-11-21 DIAGNOSIS — Z85828 Personal history of other malignant neoplasm of skin: Secondary | ICD-10-CM | POA: Diagnosis not present

## 2018-11-21 DIAGNOSIS — Z79899 Other long term (current) drug therapy: Secondary | ICD-10-CM | POA: Diagnosis not present

## 2018-11-21 DIAGNOSIS — K219 Gastro-esophageal reflux disease without esophagitis: Secondary | ICD-10-CM | POA: Diagnosis not present

## 2018-11-21 DIAGNOSIS — M1712 Unilateral primary osteoarthritis, left knee: Secondary | ICD-10-CM | POA: Diagnosis not present

## 2018-11-21 DIAGNOSIS — I10 Essential (primary) hypertension: Secondary | ICD-10-CM | POA: Diagnosis not present

## 2018-11-21 DIAGNOSIS — E7800 Pure hypercholesterolemia, unspecified: Secondary | ICD-10-CM | POA: Diagnosis not present

## 2018-11-22 DIAGNOSIS — Z96652 Presence of left artificial knee joint: Secondary | ICD-10-CM | POA: Diagnosis not present

## 2018-11-22 DIAGNOSIS — Z471 Aftercare following joint replacement surgery: Secondary | ICD-10-CM | POA: Diagnosis not present

## 2018-11-22 DIAGNOSIS — I10 Essential (primary) hypertension: Secondary | ICD-10-CM | POA: Diagnosis not present

## 2018-11-22 DIAGNOSIS — Z85828 Personal history of other malignant neoplasm of skin: Secondary | ICD-10-CM | POA: Diagnosis not present

## 2018-11-22 DIAGNOSIS — H409 Unspecified glaucoma: Secondary | ICD-10-CM | POA: Diagnosis not present

## 2018-11-24 DIAGNOSIS — Z96652 Presence of left artificial knee joint: Secondary | ICD-10-CM | POA: Diagnosis not present

## 2018-11-24 DIAGNOSIS — H409 Unspecified glaucoma: Secondary | ICD-10-CM | POA: Diagnosis not present

## 2018-11-24 DIAGNOSIS — I10 Essential (primary) hypertension: Secondary | ICD-10-CM | POA: Diagnosis not present

## 2018-11-24 DIAGNOSIS — Z471 Aftercare following joint replacement surgery: Secondary | ICD-10-CM | POA: Diagnosis not present

## 2018-11-24 DIAGNOSIS — Z85828 Personal history of other malignant neoplasm of skin: Secondary | ICD-10-CM | POA: Diagnosis not present

## 2018-11-26 DIAGNOSIS — Z6829 Body mass index (BMI) 29.0-29.9, adult: Secondary | ICD-10-CM | POA: Diagnosis not present

## 2018-11-26 DIAGNOSIS — M25569 Pain in unspecified knee: Secondary | ICD-10-CM | POA: Diagnosis not present

## 2018-11-26 DIAGNOSIS — Z96652 Presence of left artificial knee joint: Secondary | ICD-10-CM | POA: Diagnosis not present

## 2018-11-26 DIAGNOSIS — Z299 Encounter for prophylactic measures, unspecified: Secondary | ICD-10-CM | POA: Diagnosis not present

## 2018-11-26 DIAGNOSIS — I10 Essential (primary) hypertension: Secondary | ICD-10-CM | POA: Diagnosis not present

## 2018-11-26 DIAGNOSIS — Z85828 Personal history of other malignant neoplasm of skin: Secondary | ICD-10-CM | POA: Diagnosis not present

## 2018-11-26 DIAGNOSIS — Z09 Encounter for follow-up examination after completed treatment for conditions other than malignant neoplasm: Secondary | ICD-10-CM | POA: Diagnosis not present

## 2018-11-26 DIAGNOSIS — E7800 Pure hypercholesterolemia, unspecified: Secondary | ICD-10-CM | POA: Diagnosis not present

## 2018-11-26 DIAGNOSIS — H409 Unspecified glaucoma: Secondary | ICD-10-CM | POA: Diagnosis not present

## 2018-11-26 DIAGNOSIS — Z471 Aftercare following joint replacement surgery: Secondary | ICD-10-CM | POA: Diagnosis not present

## 2018-11-27 DIAGNOSIS — I10 Essential (primary) hypertension: Secondary | ICD-10-CM | POA: Diagnosis not present

## 2018-11-27 DIAGNOSIS — Z471 Aftercare following joint replacement surgery: Secondary | ICD-10-CM | POA: Diagnosis not present

## 2018-11-27 DIAGNOSIS — H409 Unspecified glaucoma: Secondary | ICD-10-CM | POA: Diagnosis not present

## 2018-11-27 DIAGNOSIS — Z96652 Presence of left artificial knee joint: Secondary | ICD-10-CM | POA: Diagnosis not present

## 2018-11-27 DIAGNOSIS — Z85828 Personal history of other malignant neoplasm of skin: Secondary | ICD-10-CM | POA: Diagnosis not present

## 2018-11-28 DIAGNOSIS — I10 Essential (primary) hypertension: Secondary | ICD-10-CM | POA: Diagnosis not present

## 2018-11-28 DIAGNOSIS — Z96652 Presence of left artificial knee joint: Secondary | ICD-10-CM | POA: Diagnosis not present

## 2018-11-28 DIAGNOSIS — H409 Unspecified glaucoma: Secondary | ICD-10-CM | POA: Diagnosis not present

## 2018-11-28 DIAGNOSIS — Z85828 Personal history of other malignant neoplasm of skin: Secondary | ICD-10-CM | POA: Diagnosis not present

## 2018-11-28 DIAGNOSIS — Z471 Aftercare following joint replacement surgery: Secondary | ICD-10-CM | POA: Diagnosis not present

## 2018-12-01 DIAGNOSIS — H409 Unspecified glaucoma: Secondary | ICD-10-CM | POA: Diagnosis not present

## 2018-12-01 DIAGNOSIS — Z85828 Personal history of other malignant neoplasm of skin: Secondary | ICD-10-CM | POA: Diagnosis not present

## 2018-12-01 DIAGNOSIS — Z471 Aftercare following joint replacement surgery: Secondary | ICD-10-CM | POA: Diagnosis not present

## 2018-12-01 DIAGNOSIS — I10 Essential (primary) hypertension: Secondary | ICD-10-CM | POA: Diagnosis not present

## 2018-12-01 DIAGNOSIS — Z96652 Presence of left artificial knee joint: Secondary | ICD-10-CM | POA: Diagnosis not present

## 2018-12-03 DIAGNOSIS — Z471 Aftercare following joint replacement surgery: Secondary | ICD-10-CM | POA: Diagnosis not present

## 2018-12-03 DIAGNOSIS — H409 Unspecified glaucoma: Secondary | ICD-10-CM | POA: Diagnosis not present

## 2018-12-03 DIAGNOSIS — I10 Essential (primary) hypertension: Secondary | ICD-10-CM | POA: Diagnosis not present

## 2018-12-03 DIAGNOSIS — Z96652 Presence of left artificial knee joint: Secondary | ICD-10-CM | POA: Diagnosis not present

## 2018-12-03 DIAGNOSIS — Z85828 Personal history of other malignant neoplasm of skin: Secondary | ICD-10-CM | POA: Diagnosis not present

## 2018-12-08 DIAGNOSIS — Z471 Aftercare following joint replacement surgery: Secondary | ICD-10-CM | POA: Diagnosis not present

## 2018-12-08 DIAGNOSIS — Z85828 Personal history of other malignant neoplasm of skin: Secondary | ICD-10-CM | POA: Diagnosis not present

## 2018-12-08 DIAGNOSIS — Z96652 Presence of left artificial knee joint: Secondary | ICD-10-CM | POA: Diagnosis not present

## 2018-12-08 DIAGNOSIS — I10 Essential (primary) hypertension: Secondary | ICD-10-CM | POA: Diagnosis not present

## 2018-12-08 DIAGNOSIS — H409 Unspecified glaucoma: Secondary | ICD-10-CM | POA: Diagnosis not present

## 2018-12-10 DIAGNOSIS — I10 Essential (primary) hypertension: Secondary | ICD-10-CM | POA: Diagnosis not present

## 2018-12-10 DIAGNOSIS — Z85828 Personal history of other malignant neoplasm of skin: Secondary | ICD-10-CM | POA: Diagnosis not present

## 2018-12-10 DIAGNOSIS — Z471 Aftercare following joint replacement surgery: Secondary | ICD-10-CM | POA: Diagnosis not present

## 2018-12-10 DIAGNOSIS — Z96652 Presence of left artificial knee joint: Secondary | ICD-10-CM | POA: Diagnosis not present

## 2018-12-10 DIAGNOSIS — H409 Unspecified glaucoma: Secondary | ICD-10-CM | POA: Diagnosis not present

## 2018-12-12 DIAGNOSIS — Z96652 Presence of left artificial knee joint: Secondary | ICD-10-CM | POA: Diagnosis not present

## 2018-12-12 DIAGNOSIS — Z85828 Personal history of other malignant neoplasm of skin: Secondary | ICD-10-CM | POA: Diagnosis not present

## 2018-12-12 DIAGNOSIS — Z471 Aftercare following joint replacement surgery: Secondary | ICD-10-CM | POA: Diagnosis not present

## 2018-12-12 DIAGNOSIS — H409 Unspecified glaucoma: Secondary | ICD-10-CM | POA: Diagnosis not present

## 2018-12-12 DIAGNOSIS — I10 Essential (primary) hypertension: Secondary | ICD-10-CM | POA: Diagnosis not present

## 2018-12-15 DIAGNOSIS — H409 Unspecified glaucoma: Secondary | ICD-10-CM | POA: Diagnosis not present

## 2018-12-15 DIAGNOSIS — I10 Essential (primary) hypertension: Secondary | ICD-10-CM | POA: Diagnosis not present

## 2018-12-15 DIAGNOSIS — Z85828 Personal history of other malignant neoplasm of skin: Secondary | ICD-10-CM | POA: Diagnosis not present

## 2018-12-15 DIAGNOSIS — Z471 Aftercare following joint replacement surgery: Secondary | ICD-10-CM | POA: Diagnosis not present

## 2018-12-15 DIAGNOSIS — Z96652 Presence of left artificial knee joint: Secondary | ICD-10-CM | POA: Diagnosis not present

## 2018-12-17 DIAGNOSIS — M810 Age-related osteoporosis without current pathological fracture: Secondary | ICD-10-CM | POA: Diagnosis not present

## 2018-12-17 DIAGNOSIS — H409 Unspecified glaucoma: Secondary | ICD-10-CM | POA: Diagnosis not present

## 2018-12-17 DIAGNOSIS — Z471 Aftercare following joint replacement surgery: Secondary | ICD-10-CM | POA: Diagnosis not present

## 2018-12-17 DIAGNOSIS — Z96652 Presence of left artificial knee joint: Secondary | ICD-10-CM | POA: Diagnosis not present

## 2018-12-17 DIAGNOSIS — I10 Essential (primary) hypertension: Secondary | ICD-10-CM | POA: Diagnosis not present

## 2018-12-17 DIAGNOSIS — Z85828 Personal history of other malignant neoplasm of skin: Secondary | ICD-10-CM | POA: Diagnosis not present

## 2018-12-19 DIAGNOSIS — H409 Unspecified glaucoma: Secondary | ICD-10-CM | POA: Diagnosis not present

## 2018-12-19 DIAGNOSIS — I10 Essential (primary) hypertension: Secondary | ICD-10-CM | POA: Diagnosis not present

## 2018-12-19 DIAGNOSIS — Z85828 Personal history of other malignant neoplasm of skin: Secondary | ICD-10-CM | POA: Diagnosis not present

## 2018-12-19 DIAGNOSIS — Z471 Aftercare following joint replacement surgery: Secondary | ICD-10-CM | POA: Diagnosis not present

## 2018-12-19 DIAGNOSIS — Z1231 Encounter for screening mammogram for malignant neoplasm of breast: Secondary | ICD-10-CM | POA: Diagnosis not present

## 2018-12-19 DIAGNOSIS — Z96652 Presence of left artificial knee joint: Secondary | ICD-10-CM | POA: Diagnosis not present

## 2018-12-22 DIAGNOSIS — Z96652 Presence of left artificial knee joint: Secondary | ICD-10-CM | POA: Diagnosis not present

## 2018-12-22 DIAGNOSIS — H409 Unspecified glaucoma: Secondary | ICD-10-CM | POA: Diagnosis not present

## 2018-12-22 DIAGNOSIS — Z471 Aftercare following joint replacement surgery: Secondary | ICD-10-CM | POA: Diagnosis not present

## 2018-12-22 DIAGNOSIS — I10 Essential (primary) hypertension: Secondary | ICD-10-CM | POA: Diagnosis not present

## 2018-12-22 DIAGNOSIS — Z85828 Personal history of other malignant neoplasm of skin: Secondary | ICD-10-CM | POA: Diagnosis not present

## 2018-12-24 DIAGNOSIS — H409 Unspecified glaucoma: Secondary | ICD-10-CM | POA: Diagnosis not present

## 2018-12-24 DIAGNOSIS — Z471 Aftercare following joint replacement surgery: Secondary | ICD-10-CM | POA: Diagnosis not present

## 2018-12-24 DIAGNOSIS — Z85828 Personal history of other malignant neoplasm of skin: Secondary | ICD-10-CM | POA: Diagnosis not present

## 2018-12-24 DIAGNOSIS — I10 Essential (primary) hypertension: Secondary | ICD-10-CM | POA: Diagnosis not present

## 2018-12-24 DIAGNOSIS — Z96652 Presence of left artificial knee joint: Secondary | ICD-10-CM | POA: Diagnosis not present

## 2019-01-05 DIAGNOSIS — Z96652 Presence of left artificial knee joint: Secondary | ICD-10-CM | POA: Diagnosis not present

## 2019-01-05 DIAGNOSIS — Z471 Aftercare following joint replacement surgery: Secondary | ICD-10-CM | POA: Diagnosis not present

## 2019-01-07 DIAGNOSIS — M25562 Pain in left knee: Secondary | ICD-10-CM | POA: Diagnosis not present

## 2019-01-07 DIAGNOSIS — Z471 Aftercare following joint replacement surgery: Secondary | ICD-10-CM | POA: Diagnosis not present

## 2019-01-08 DIAGNOSIS — Z471 Aftercare following joint replacement surgery: Secondary | ICD-10-CM | POA: Diagnosis not present

## 2019-01-08 DIAGNOSIS — M25562 Pain in left knee: Secondary | ICD-10-CM | POA: Diagnosis not present

## 2019-01-12 DIAGNOSIS — Z471 Aftercare following joint replacement surgery: Secondary | ICD-10-CM | POA: Diagnosis not present

## 2019-01-12 DIAGNOSIS — M25562 Pain in left knee: Secondary | ICD-10-CM | POA: Diagnosis not present

## 2019-01-14 DIAGNOSIS — Z471 Aftercare following joint replacement surgery: Secondary | ICD-10-CM | POA: Diagnosis not present

## 2019-01-14 DIAGNOSIS — M25562 Pain in left knee: Secondary | ICD-10-CM | POA: Diagnosis not present

## 2019-01-15 DIAGNOSIS — Z471 Aftercare following joint replacement surgery: Secondary | ICD-10-CM | POA: Diagnosis not present

## 2019-01-15 DIAGNOSIS — M25562 Pain in left knee: Secondary | ICD-10-CM | POA: Diagnosis not present

## 2019-02-12 DIAGNOSIS — Z961 Presence of intraocular lens: Secondary | ICD-10-CM | POA: Diagnosis not present

## 2019-02-12 DIAGNOSIS — H0102B Squamous blepharitis left eye, upper and lower eyelids: Secondary | ICD-10-CM | POA: Diagnosis not present

## 2019-02-12 DIAGNOSIS — H04123 Dry eye syndrome of bilateral lacrimal glands: Secondary | ICD-10-CM | POA: Diagnosis not present

## 2019-02-12 DIAGNOSIS — H40011 Open angle with borderline findings, low risk, right eye: Secondary | ICD-10-CM | POA: Diagnosis not present

## 2019-02-12 DIAGNOSIS — H0102A Squamous blepharitis right eye, upper and lower eyelids: Secondary | ICD-10-CM | POA: Diagnosis not present

## 2019-02-12 DIAGNOSIS — H401422 Capsular glaucoma with pseudoexfoliation of lens, left eye, moderate stage: Secondary | ICD-10-CM | POA: Diagnosis not present

## 2019-04-02 DIAGNOSIS — Z23 Encounter for immunization: Secondary | ICD-10-CM | POA: Diagnosis not present

## 2019-05-01 DIAGNOSIS — Z23 Encounter for immunization: Secondary | ICD-10-CM | POA: Diagnosis not present

## 2019-05-12 DIAGNOSIS — Z789 Other specified health status: Secondary | ICD-10-CM | POA: Diagnosis not present

## 2019-05-12 DIAGNOSIS — M810 Age-related osteoporosis without current pathological fracture: Secondary | ICD-10-CM | POA: Diagnosis not present

## 2019-05-12 DIAGNOSIS — I10 Essential (primary) hypertension: Secondary | ICD-10-CM | POA: Diagnosis not present

## 2019-05-12 DIAGNOSIS — Z299 Encounter for prophylactic measures, unspecified: Secondary | ICD-10-CM | POA: Diagnosis not present

## 2019-05-22 DIAGNOSIS — M818 Other osteoporosis without current pathological fracture: Secondary | ICD-10-CM | POA: Diagnosis not present

## 2019-05-22 DIAGNOSIS — E8940 Asymptomatic postprocedural ovarian failure: Secondary | ICD-10-CM | POA: Diagnosis not present

## 2019-05-22 DIAGNOSIS — M859 Disorder of bone density and structure, unspecified: Secondary | ICD-10-CM | POA: Diagnosis not present

## 2019-06-24 DIAGNOSIS — M810 Age-related osteoporosis without current pathological fracture: Secondary | ICD-10-CM | POA: Diagnosis not present

## 2019-08-19 DIAGNOSIS — L718 Other rosacea: Secondary | ICD-10-CM | POA: Diagnosis not present

## 2019-08-19 DIAGNOSIS — L821 Other seborrheic keratosis: Secondary | ICD-10-CM | POA: Diagnosis not present

## 2019-08-19 DIAGNOSIS — L570 Actinic keratosis: Secondary | ICD-10-CM | POA: Diagnosis not present

## 2019-08-26 DIAGNOSIS — M810 Age-related osteoporosis without current pathological fracture: Secondary | ICD-10-CM | POA: Diagnosis not present

## 2019-08-26 DIAGNOSIS — Z299 Encounter for prophylactic measures, unspecified: Secondary | ICD-10-CM | POA: Diagnosis not present

## 2019-08-26 DIAGNOSIS — I10 Essential (primary) hypertension: Secondary | ICD-10-CM | POA: Diagnosis not present

## 2019-08-26 DIAGNOSIS — E7800 Pure hypercholesterolemia, unspecified: Secondary | ICD-10-CM | POA: Diagnosis not present

## 2019-11-13 DIAGNOSIS — Z23 Encounter for immunization: Secondary | ICD-10-CM | POA: Diagnosis not present

## 2019-11-16 DIAGNOSIS — Z471 Aftercare following joint replacement surgery: Secondary | ICD-10-CM | POA: Diagnosis not present

## 2019-11-16 DIAGNOSIS — M25562 Pain in left knee: Secondary | ICD-10-CM | POA: Diagnosis not present

## 2019-11-16 DIAGNOSIS — Z96652 Presence of left artificial knee joint: Secondary | ICD-10-CM | POA: Diagnosis not present

## 2019-11-24 DIAGNOSIS — Z0000 Encounter for general adult medical examination without abnormal findings: Secondary | ICD-10-CM | POA: Diagnosis not present

## 2019-11-24 DIAGNOSIS — I10 Essential (primary) hypertension: Secondary | ICD-10-CM | POA: Diagnosis not present

## 2019-11-24 DIAGNOSIS — Z1339 Encounter for screening examination for other mental health and behavioral disorders: Secondary | ICD-10-CM | POA: Diagnosis not present

## 2019-11-24 DIAGNOSIS — Z79899 Other long term (current) drug therapy: Secondary | ICD-10-CM | POA: Diagnosis not present

## 2019-11-24 DIAGNOSIS — Z299 Encounter for prophylactic measures, unspecified: Secondary | ICD-10-CM | POA: Diagnosis not present

## 2019-11-24 DIAGNOSIS — E559 Vitamin D deficiency, unspecified: Secondary | ICD-10-CM | POA: Diagnosis not present

## 2019-11-24 DIAGNOSIS — R5383 Other fatigue: Secondary | ICD-10-CM | POA: Diagnosis not present

## 2019-11-24 DIAGNOSIS — E7800 Pure hypercholesterolemia, unspecified: Secondary | ICD-10-CM | POA: Diagnosis not present

## 2019-11-24 DIAGNOSIS — Z6829 Body mass index (BMI) 29.0-29.9, adult: Secondary | ICD-10-CM | POA: Diagnosis not present

## 2019-11-24 DIAGNOSIS — Z1331 Encounter for screening for depression: Secondary | ICD-10-CM | POA: Diagnosis not present

## 2019-11-24 DIAGNOSIS — Z7189 Other specified counseling: Secondary | ICD-10-CM | POA: Diagnosis not present

## 2019-11-26 DIAGNOSIS — Z299 Encounter for prophylactic measures, unspecified: Secondary | ICD-10-CM | POA: Diagnosis not present

## 2019-11-26 DIAGNOSIS — I10 Essential (primary) hypertension: Secondary | ICD-10-CM | POA: Diagnosis not present

## 2019-11-26 DIAGNOSIS — M25512 Pain in left shoulder: Secondary | ICD-10-CM | POA: Diagnosis not present

## 2019-12-04 DIAGNOSIS — Z299 Encounter for prophylactic measures, unspecified: Secondary | ICD-10-CM | POA: Diagnosis not present

## 2019-12-04 DIAGNOSIS — M25512 Pain in left shoulder: Secondary | ICD-10-CM | POA: Diagnosis not present

## 2019-12-04 DIAGNOSIS — I10 Essential (primary) hypertension: Secondary | ICD-10-CM | POA: Diagnosis not present

## 2019-12-04 DIAGNOSIS — M542 Cervicalgia: Secondary | ICD-10-CM | POA: Diagnosis not present

## 2019-12-09 DIAGNOSIS — J019 Acute sinusitis, unspecified: Secondary | ICD-10-CM | POA: Diagnosis not present

## 2019-12-09 DIAGNOSIS — Z20822 Contact with and (suspected) exposure to covid-19: Secondary | ICD-10-CM | POA: Diagnosis not present

## 2019-12-09 DIAGNOSIS — J209 Acute bronchitis, unspecified: Secondary | ICD-10-CM | POA: Diagnosis not present

## 2019-12-09 DIAGNOSIS — Z0131 Encounter for examination of blood pressure with abnormal findings: Secondary | ICD-10-CM | POA: Diagnosis not present

## 2019-12-09 DIAGNOSIS — J029 Acute pharyngitis, unspecified: Secondary | ICD-10-CM | POA: Diagnosis not present

## 2019-12-23 DIAGNOSIS — Z1231 Encounter for screening mammogram for malignant neoplasm of breast: Secondary | ICD-10-CM | POA: Diagnosis not present

## 2020-01-06 DIAGNOSIS — M810 Age-related osteoporosis without current pathological fracture: Secondary | ICD-10-CM | POA: Diagnosis not present

## 2020-01-06 DIAGNOSIS — Z79899 Other long term (current) drug therapy: Secondary | ICD-10-CM | POA: Diagnosis not present

## 2020-01-08 DIAGNOSIS — M25512 Pain in left shoulder: Secondary | ICD-10-CM | POA: Diagnosis not present

## 2020-01-08 DIAGNOSIS — M25561 Pain in right knee: Secondary | ICD-10-CM | POA: Diagnosis not present

## 2020-02-16 DIAGNOSIS — H04123 Dry eye syndrome of bilateral lacrimal glands: Secondary | ICD-10-CM | POA: Diagnosis not present

## 2020-02-16 DIAGNOSIS — H40011 Open angle with borderline findings, low risk, right eye: Secondary | ICD-10-CM | POA: Diagnosis not present

## 2020-02-16 DIAGNOSIS — Z961 Presence of intraocular lens: Secondary | ICD-10-CM | POA: Diagnosis not present

## 2020-02-16 DIAGNOSIS — H0102B Squamous blepharitis left eye, upper and lower eyelids: Secondary | ICD-10-CM | POA: Diagnosis not present

## 2020-02-16 DIAGNOSIS — H401422 Capsular glaucoma with pseudoexfoliation of lens, left eye, moderate stage: Secondary | ICD-10-CM | POA: Diagnosis not present

## 2020-02-16 DIAGNOSIS — H0102A Squamous blepharitis right eye, upper and lower eyelids: Secondary | ICD-10-CM | POA: Diagnosis not present

## 2020-02-25 DIAGNOSIS — M1990 Unspecified osteoarthritis, unspecified site: Secondary | ICD-10-CM | POA: Diagnosis not present

## 2020-02-25 DIAGNOSIS — I10 Essential (primary) hypertension: Secondary | ICD-10-CM | POA: Diagnosis not present

## 2020-02-25 DIAGNOSIS — Z299 Encounter for prophylactic measures, unspecified: Secondary | ICD-10-CM | POA: Diagnosis not present

## 2020-04-20 ENCOUNTER — Other Ambulatory Visit: Payer: Self-pay

## 2020-04-20 ENCOUNTER — Ambulatory Visit (INDEPENDENT_AMBULATORY_CARE_PROVIDER_SITE_OTHER): Payer: Medicare Other | Admitting: Physician Assistant

## 2020-04-20 ENCOUNTER — Encounter: Payer: Self-pay | Admitting: Physician Assistant

## 2020-04-20 DIAGNOSIS — D485 Neoplasm of uncertain behavior of skin: Secondary | ICD-10-CM

## 2020-04-20 DIAGNOSIS — Z1283 Encounter for screening for malignant neoplasm of skin: Secondary | ICD-10-CM | POA: Diagnosis not present

## 2020-04-20 DIAGNOSIS — D0421 Carcinoma in situ of skin of right ear and external auricular canal: Secondary | ICD-10-CM | POA: Diagnosis not present

## 2020-04-20 DIAGNOSIS — D0461 Carcinoma in situ of skin of right upper limb, including shoulder: Secondary | ICD-10-CM | POA: Diagnosis not present

## 2020-04-20 DIAGNOSIS — L570 Actinic keratosis: Secondary | ICD-10-CM

## 2020-04-20 DIAGNOSIS — C4492 Squamous cell carcinoma of skin, unspecified: Secondary | ICD-10-CM

## 2020-04-20 DIAGNOSIS — L739 Follicular disorder, unspecified: Secondary | ICD-10-CM | POA: Diagnosis not present

## 2020-04-20 HISTORY — DX: Squamous cell carcinoma of skin, unspecified: C44.92

## 2020-04-20 MED ORDER — TOLAK 4 % EX CREA: 1.0000 "application " | TOPICAL_CREAM | Freq: Every evening | CUTANEOUS | 0 refills | Status: AC

## 2020-04-20 NOTE — Progress Notes (Addendum)
New Patient   Subjective  Robin Chambers is a 80 y.o. female who presents for the following: Annual Exam (Full body skin exam. mohs surgery on left ear x 4/5 years ago per patient feels like sandpaper near lobe, scale comes and goes. Not tender, no bleeding. Spots on arms patient wants to discuss only with [place].).   The following portions of the chart were reviewed this encounter and updated as appropriate:  Tobacco  Allergies  Meds  Problems  Med Hx  Surg Hx  Fam Hx      Objective  Well appearing patient in no apparent distress; mood and affect are within normal limits.  A full examination was performed including scalp, head, eyes, ears, nose, lips, neck, chest, axillae, abdomen, back, buttocks, bilateral upper extremities, bilateral lower extremities, hands, feet, fingers, toes, fingernails, and toenails. All findings within normal limits unless otherwise noted below.  Objective  Legs Up: No atypical nevi Legs up exam today  Objective  Left Scaphoid Fossa: Pink crust       Objective  Right Scaphoid Fossa: Pink crust       Objective  Chest - Medial (Center) (8), Left Forearm - Posterior (6), Left Upper Vermilion Lip (3), Right Forearm - Posterior (6): Erythematous patches with gritty scale.  Assessment & Plan  Skin exam for malignant neoplasm Legs Up  Yearly skin check  Neoplasm of uncertain behavior of skin (2) Left Scaphoid Fossa  Skin / nail biopsy Type of biopsy: tangential   Informed consent: discussed and consent obtained   Timeout: patient name, date of birth, surgical site, and procedure verified   Procedure prep:  Patient was prepped and draped in usual sterile fashion (Non sterile) Prep type:  Chlorhexidine Anesthesia: the lesion was anesthetized in a standard fashion   Anesthetic:  1% lidocaine w/ epinephrine 1-100,000 local infiltration Instrument used: flexible razor blade   Hemostasis achieved with: aluminum chloride   Outcome:  patient tolerated procedure well   Post-procedure details: sterile dressing applied and wound care instructions given   Dressing type: bandage and petrolatum    Specimen 1 - Surgical pathology Differential Diagnosis: R/O BCC vs SCC  Check Margins: No  Right Scaphoid Fossa  Skin / nail biopsy Type of biopsy: tangential   Informed consent: discussed and consent obtained   Timeout: patient name, date of birth, surgical site, and procedure verified   Procedure prep:  Patient was prepped and draped in usual sterile fashion (Non sterile) Prep type:  Chlorhexidine Anesthesia: the lesion was anesthetized in a standard fashion   Anesthetic:  1% lidocaine w/ epinephrine 1-100,000 local infiltration Instrument used: flexible razor blade   Hemostasis achieved with: aluminum chloride   Outcome: patient tolerated procedure well   Post-procedure details: sterile dressing applied and wound care instructions given   Dressing type: bandage and petrolatum    Specimen 2 - Surgical pathology Differential Diagnosis: R/O BCC vs SCC  Check Margins: No  AK (actinic keratosis) (23) Left Forearm - Posterior (6); Right Forearm - Posterior (6); Chest - Medial (Center) (8); Left Upper Vermilion Lip (3)    Destruction of lesion - Chest - Medial (Center), Left Forearm - Posterior, Left Upper Vermilion Lip, Right Forearm - Posterior Complexity: simple   Destruction method: cryotherapy   Informed consent: discussed and consent obtained   Timeout:  patient name, date of birth, surgical site, and procedure verified Lesion destroyed using liquid nitrogen: Yes   Cryotherapy cycles:  3 Outcome: patient tolerated procedure  well with no complications   Post-procedure details: wound care instructions given    Fluorouracil (TOLAK) 4 % CREA - Chest - Medial [facility]), Left Forearm - Posterior, Left Upper Vermilion Lip, Right Forearm - Posterior    I, Fraidy Mccarrick, PA-C, have reviewed all documentation for  this visit. The documentation on 06/07/20 for the exam, diagnosis, procedures, and orders are all accurate and complete.

## 2020-04-20 NOTE — Patient Instructions (Addendum)
Biopsy, Surgery (Curettage) & Surgery (Excision) Aftercare Instructions  1. Okay to remove bandage in 24 hours  2. Wash area with soap and water  3. Apply Vaseline to area twice daily until healed (Not Neosporin)  4. Okay to cover with a Band-Aid to decrease the chance of infection or prevent irritation from clothing; also it's okay to uncover lesion at home.  5. Suture instructions: return to our office in 7-10 or 10-14 days for a nurse visit for suture removal. Variable healing with sutures, if pain or itching occurs call our office. It's okay to shower or bathe 24 hours after sutures are given.  6. The following risks may occur after a biopsy, curettage or excision: bleeding, scarring, discoloration, recurrence, infection (redness, yellow drainage, pain or swelling).  7. For questions, concerns and results call our office at [place] before 4pm & Friday before 3pm. Biopsy results will be available in 1 week.    If you haven't heard from [facility] by the end of the business day today give them a call tomorrow @ [phone]

## 2020-04-26 ENCOUNTER — Telehealth: Payer: Self-pay | Admitting: *Deleted

## 2020-04-26 NOTE — Telephone Encounter (Signed)
Pathology to patient- surgery appointment scheduled for 07/06/2020.  Patient wanted to  Know if she was suppose to continue to use Fluorouracil cream- per [facility] [place] patient needs to stop therapy and let her treat that spot in June and then she will advise her how/when to use the Fluorouracil cream this fall or winter.

## 2020-04-26 NOTE — Telephone Encounter (Signed)
-----   Message from Warren Danes, [place] sent at 04/26/2020  9:21 AM EDT ----- 15 surgery

## 2020-05-26 DIAGNOSIS — Z789 Other specified health status: Secondary | ICD-10-CM | POA: Diagnosis not present

## 2020-05-26 DIAGNOSIS — Z299 Encounter for prophylactic measures, unspecified: Secondary | ICD-10-CM | POA: Diagnosis not present

## 2020-05-26 DIAGNOSIS — I10 Essential (primary) hypertension: Secondary | ICD-10-CM | POA: Diagnosis not present

## 2020-06-13 DIAGNOSIS — E8940 Asymptomatic postprocedural ovarian failure: Secondary | ICD-10-CM | POA: Diagnosis not present

## 2020-07-06 ENCOUNTER — Encounter: Payer: Self-pay | Admitting: Physician Assistant

## 2020-07-06 ENCOUNTER — Ambulatory Visit (INDEPENDENT_AMBULATORY_CARE_PROVIDER_SITE_OTHER): Payer: Medicare Other | Admitting: Physician Assistant

## 2020-07-06 ENCOUNTER — Other Ambulatory Visit: Payer: Self-pay

## 2020-07-06 DIAGNOSIS — D0421 Carcinoma in situ of skin of right ear and external auricular canal: Secondary | ICD-10-CM | POA: Diagnosis not present

## 2020-07-06 NOTE — Progress Notes (Signed)
   Follow-Up Visit   Subjective  Robin Chambers is a 80 y.o. female who presents for the following: Procedure (Scc on ear but patient used tolak unsure how many days).   The following portions of the chart were reviewed this encounter and updated as appropriate:  Tobacco  Allergies  Meds  Problems  Med Hx  Surg Hx  Fam Hx      Objective  Well appearing patient in no apparent distress; mood and affect are within normal limits.  A focused examination was performed including right ear. Relevant physical exam findings are noted in the Assessment and Plan.  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  Destruction of lesion Complexity: simple   Destruction method: electrodesiccation and curettage   Informed consent: discussed and consent obtained   Timeout:  patient name, date of birth, surgical site, and procedure verified Anesthesia: the lesion was anesthetized in a standard fashion   Anesthetic:  1% lidocaine w/ epinephrine 1-100,000 local infiltration Curettage performed in three different directions: Yes   Electrodesiccation performed over the curetted area: Yes   Curettage cycles:  3 Margin per side (cm):  0.1 Final wound size (cm):  1 Hemostasis achieved with:  aluminum chloride Outcome: patient tolerated procedure well with no complications   Post-procedure details: wound care instructions given      I, Jezlyn Westerfield, PA-C, have reviewed all documentation's for this visit.  The documentation on 07/06/20 for the exam, diagnosis, procedures and orders are all accurate and complete.

## 2020-07-06 NOTE — Patient Instructions (Signed)

## 2020-07-15 DIAGNOSIS — M810 Age-related osteoporosis without current pathological fracture: Secondary | ICD-10-CM | POA: Diagnosis not present

## 2020-08-31 DIAGNOSIS — I10 Essential (primary) hypertension: Secondary | ICD-10-CM | POA: Diagnosis not present

## 2020-08-31 DIAGNOSIS — Z299 Encounter for prophylactic measures, unspecified: Secondary | ICD-10-CM | POA: Diagnosis not present

## 2020-09-16 DIAGNOSIS — Z299 Encounter for prophylactic measures, unspecified: Secondary | ICD-10-CM | POA: Diagnosis not present

## 2020-09-16 DIAGNOSIS — J329 Chronic sinusitis, unspecified: Secondary | ICD-10-CM | POA: Diagnosis not present

## 2020-09-16 DIAGNOSIS — U071 COVID-19: Secondary | ICD-10-CM | POA: Diagnosis not present

## 2020-09-22 DIAGNOSIS — U071 COVID-19: Secondary | ICD-10-CM | POA: Diagnosis not present

## 2020-09-22 DIAGNOSIS — R110 Nausea: Secondary | ICD-10-CM | POA: Diagnosis not present

## 2020-09-22 DIAGNOSIS — Z299 Encounter for prophylactic measures, unspecified: Secondary | ICD-10-CM | POA: Diagnosis not present

## 2020-09-22 DIAGNOSIS — I10 Essential (primary) hypertension: Secondary | ICD-10-CM | POA: Diagnosis not present

## 2020-10-11 ENCOUNTER — Ambulatory Visit (INDEPENDENT_AMBULATORY_CARE_PROVIDER_SITE_OTHER): Payer: Medicare Other | Admitting: Physician Assistant

## 2020-10-11 ENCOUNTER — Other Ambulatory Visit: Payer: Self-pay

## 2020-10-11 ENCOUNTER — Encounter: Payer: Self-pay | Admitting: Physician Assistant

## 2020-10-11 DIAGNOSIS — Z1283 Encounter for screening for malignant neoplasm of skin: Secondary | ICD-10-CM

## 2020-10-11 NOTE — Progress Notes (Signed)
   Follow-Up Visit   Subjective  Robin Chambers is a 80 y.o. female who presents for the following: Follow-up (Right scaphoid fossa - healing well, no concerns).   The following portions of the chart were reviewed this encounter and updated as appropriate:  Tobacco  Allergies  Meds  Problems  Med Hx  Surg Hx  Fam Hx      Objective  Well appearing patient in no apparent distress; mood and affect are within normal limits.  All skin waist up examined.  Waist up No atypical nevi No signs of non-mole skin cancer. Scar on left ear was clear.   Assessment & Plan  Encounter for screening for malignant neoplasm of skin Waist up  Yearly skin examinations    I, Aerith Canal, PA-C, have reviewed all documentation's for this visit.  The documentation on 10/11/20 for the exam, diagnosis, procedures and orders are all accurate and complete.

## 2020-11-16 IMAGING — US SOFT TISSUE ULTRASOUND HEAD/NECK
1 series · 11 of 11 positions shown · non-contrast
Comparison: None.

CLINICAL DATA: 77-year-old female with a history of
dysphagia/globus

EXAM:
ULTRASOUND OF HEAD/NECK SOFT TISSUES
TECHNIQUE: Ultrasound examination of the head and neck soft tissues was
performed in the area of clinical concern.

[Series 1: soft tissue ultrasound head/neck · 0.06mm/px · 11 of 11 slices shown]
[im 1/11]
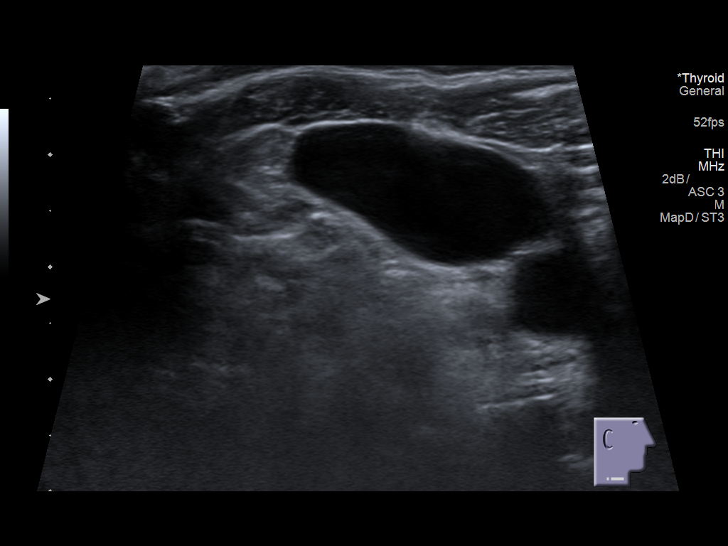
[im 2/11]
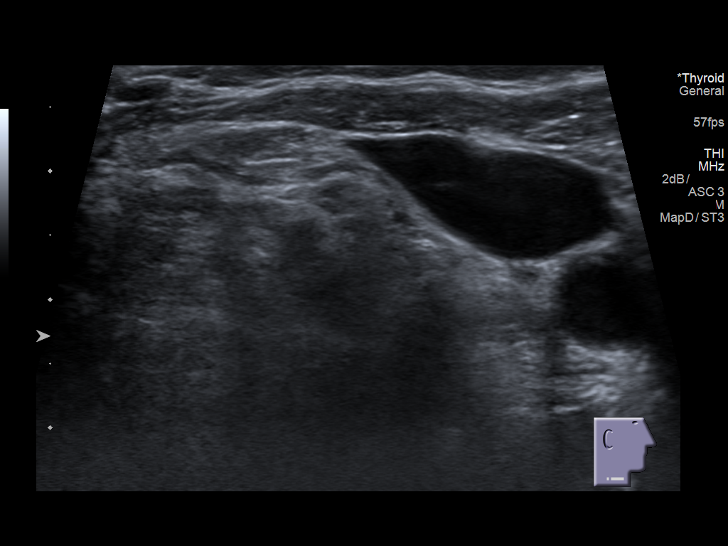
[im 3/11]
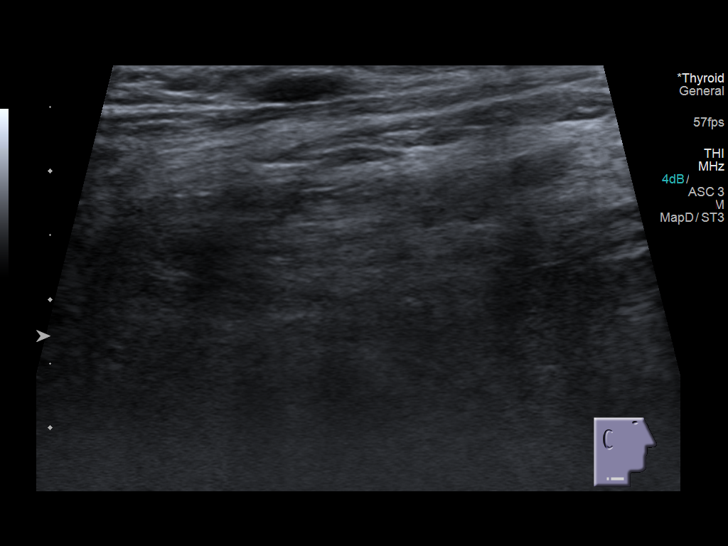
[im 4/11]
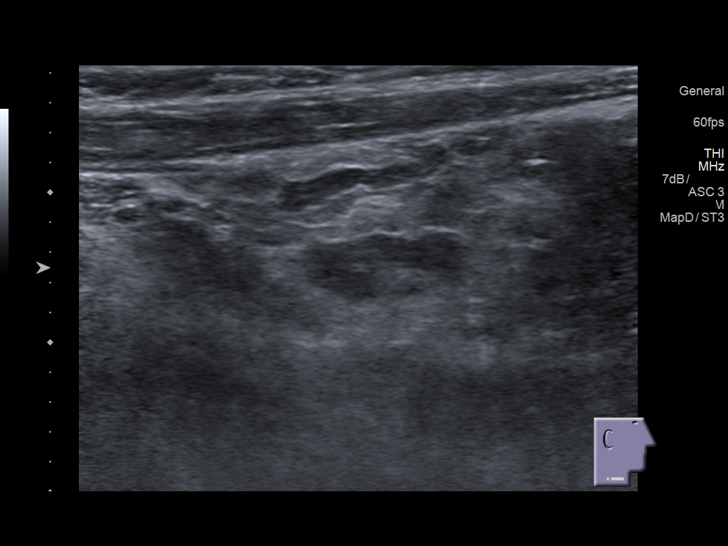
[im 5/11]
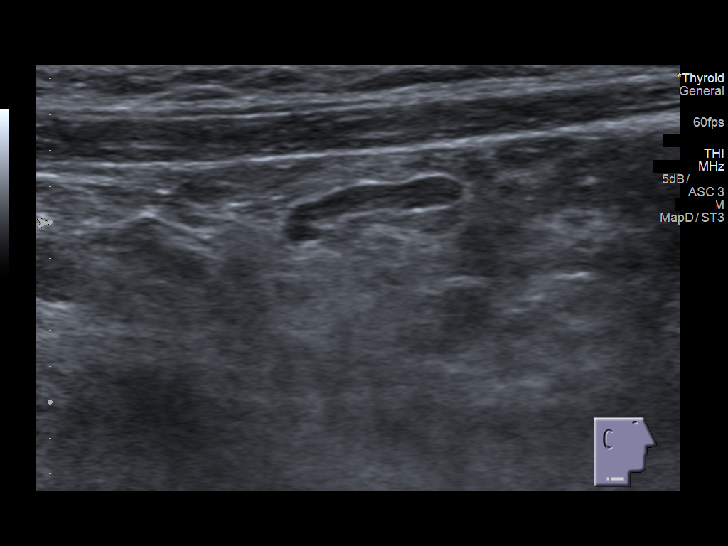
[im 6/11]
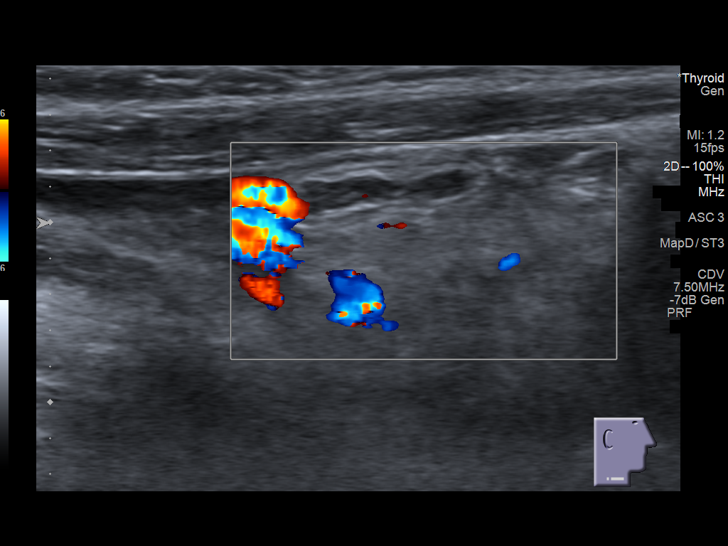
[im 7/11]
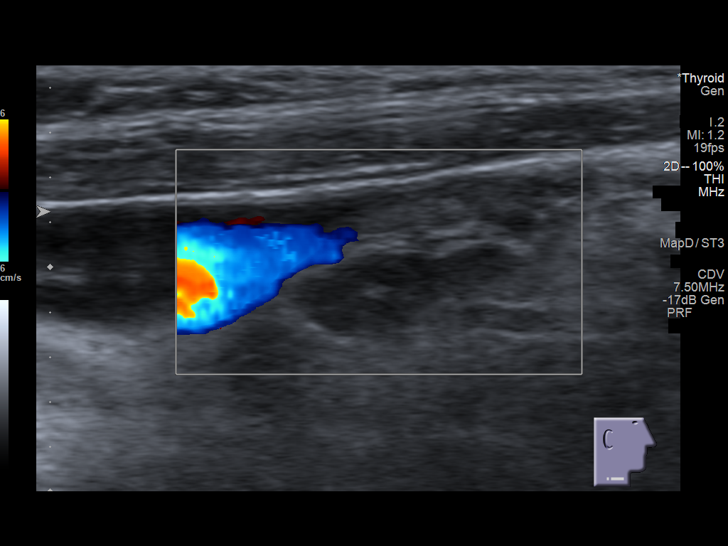
[im 8/11]
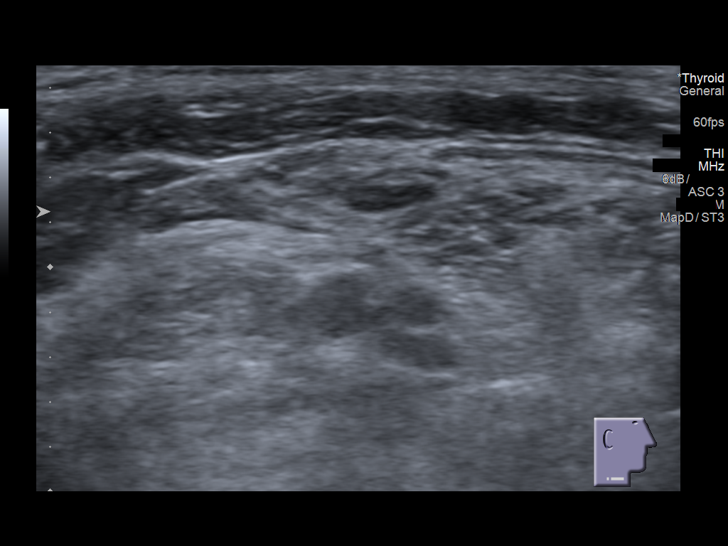
[im 9/11]
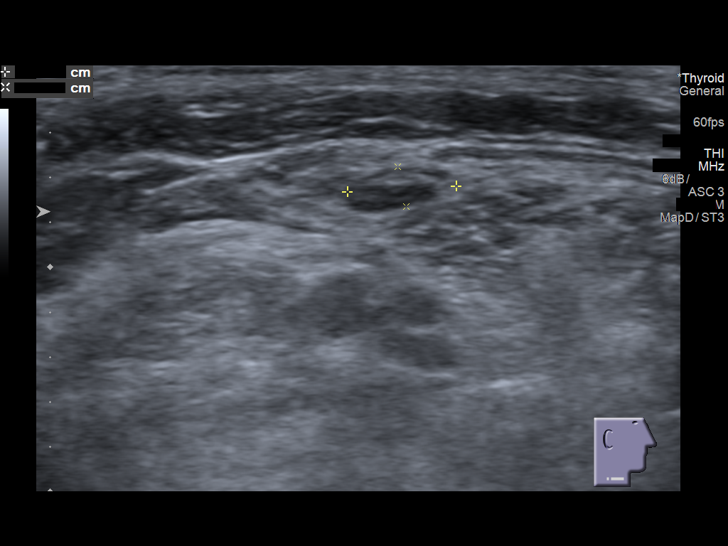
[im 10/11]
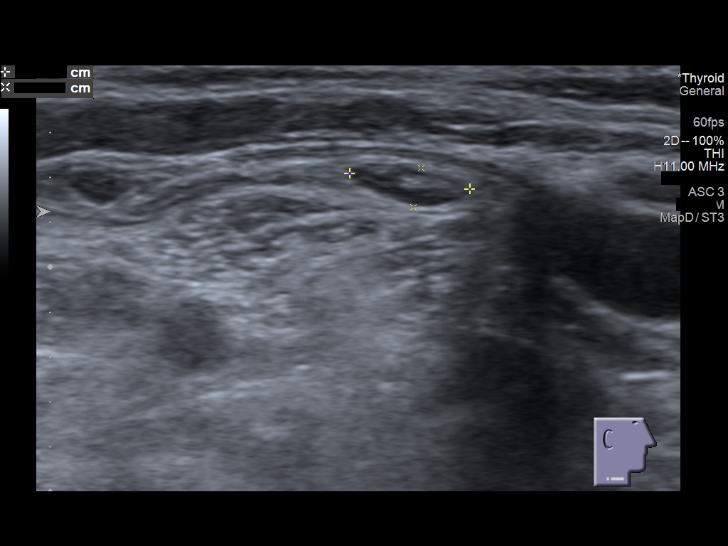
[im 11/11]
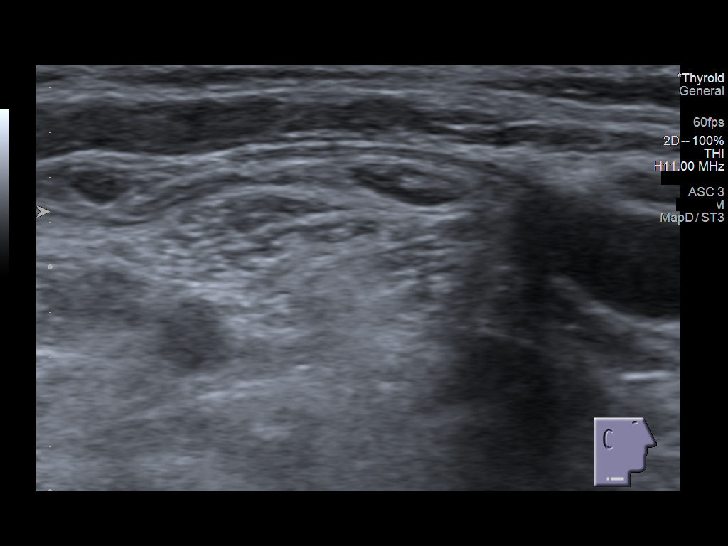

[11 of 11 positions shown; findings below may reference images not displayed]

FINDINGS: Grayscale and color duplex performed in the region clinical concern.
No focal fluid. No soft tissue lesion. No adenopathy. Small lymph
nodes with typical architecture.
IMPRESSION: Sonographic survey demonstrates typical appearing lymph nodes in the
region of clinical concern, potentially reactive.

## 2020-12-07 DIAGNOSIS — Z6828 Body mass index (BMI) 28.0-28.9, adult: Secondary | ICD-10-CM | POA: Diagnosis not present

## 2020-12-07 DIAGNOSIS — Z1339 Encounter for screening examination for other mental health and behavioral disorders: Secondary | ICD-10-CM | POA: Diagnosis not present

## 2020-12-07 DIAGNOSIS — R5383 Other fatigue: Secondary | ICD-10-CM | POA: Diagnosis not present

## 2020-12-07 DIAGNOSIS — E7800 Pure hypercholesterolemia, unspecified: Secondary | ICD-10-CM | POA: Diagnosis not present

## 2020-12-07 DIAGNOSIS — Z23 Encounter for immunization: Secondary | ICD-10-CM | POA: Diagnosis not present

## 2020-12-07 DIAGNOSIS — Z299 Encounter for prophylactic measures, unspecified: Secondary | ICD-10-CM | POA: Diagnosis not present

## 2020-12-07 DIAGNOSIS — I10 Essential (primary) hypertension: Secondary | ICD-10-CM | POA: Diagnosis not present

## 2020-12-07 DIAGNOSIS — Z1331 Encounter for screening for depression: Secondary | ICD-10-CM | POA: Diagnosis not present

## 2020-12-07 DIAGNOSIS — Z0000 Encounter for general adult medical examination without abnormal findings: Secondary | ICD-10-CM | POA: Diagnosis not present

## 2020-12-07 DIAGNOSIS — Z7189 Other specified counseling: Secondary | ICD-10-CM | POA: Diagnosis not present

## 2020-12-07 DIAGNOSIS — Z789 Other specified health status: Secondary | ICD-10-CM | POA: Diagnosis not present

## 2020-12-07 DIAGNOSIS — Z6831 Body mass index (BMI) 31.0-31.9, adult: Secondary | ICD-10-CM | POA: Diagnosis not present

## 2020-12-07 DIAGNOSIS — Z79899 Other long term (current) drug therapy: Secondary | ICD-10-CM | POA: Diagnosis not present

## 2020-12-24 DIAGNOSIS — M47816 Spondylosis without myelopathy or radiculopathy, lumbar region: Secondary | ICD-10-CM | POA: Diagnosis not present

## 2020-12-24 DIAGNOSIS — M549 Dorsalgia, unspecified: Secondary | ICD-10-CM | POA: Diagnosis not present

## 2020-12-24 DIAGNOSIS — R319 Hematuria, unspecified: Secondary | ICD-10-CM | POA: Diagnosis not present

## 2020-12-24 DIAGNOSIS — M545 Low back pain, unspecified: Secondary | ICD-10-CM | POA: Diagnosis not present

## 2020-12-26 DIAGNOSIS — R319 Hematuria, unspecified: Secondary | ICD-10-CM | POA: Diagnosis not present

## 2020-12-26 DIAGNOSIS — L299 Pruritus, unspecified: Secondary | ICD-10-CM | POA: Diagnosis not present

## 2020-12-26 DIAGNOSIS — I10 Essential (primary) hypertension: Secondary | ICD-10-CM | POA: Diagnosis not present

## 2020-12-26 DIAGNOSIS — Z6828 Body mass index (BMI) 28.0-28.9, adult: Secondary | ICD-10-CM | POA: Diagnosis not present

## 2020-12-26 DIAGNOSIS — Z299 Encounter for prophylactic measures, unspecified: Secondary | ICD-10-CM | POA: Diagnosis not present

## 2020-12-26 DIAGNOSIS — M5430 Sciatica, unspecified side: Secondary | ICD-10-CM | POA: Diagnosis not present

## 2021-01-06 DIAGNOSIS — M545 Low back pain, unspecified: Secondary | ICD-10-CM | POA: Diagnosis not present

## 2021-01-09 DIAGNOSIS — M47816 Spondylosis without myelopathy or radiculopathy, lumbar region: Secondary | ICD-10-CM | POA: Diagnosis not present

## 2021-01-09 DIAGNOSIS — M4316 Spondylolisthesis, lumbar region: Secondary | ICD-10-CM | POA: Diagnosis not present

## 2021-01-09 DIAGNOSIS — M7138 Other bursal cyst, other site: Secondary | ICD-10-CM | POA: Diagnosis not present

## 2021-01-09 DIAGNOSIS — M4805 Spinal stenosis, thoracolumbar region: Secondary | ICD-10-CM | POA: Diagnosis not present

## 2021-01-13 DIAGNOSIS — M545 Low back pain, unspecified: Secondary | ICD-10-CM | POA: Diagnosis not present

## 2021-01-16 DIAGNOSIS — M810 Age-related osteoporosis without current pathological fracture: Secondary | ICD-10-CM | POA: Diagnosis not present

## 2021-02-08 DIAGNOSIS — M48061 Spinal stenosis, lumbar region without neurogenic claudication: Secondary | ICD-10-CM | POA: Diagnosis not present

## 2021-02-16 DIAGNOSIS — H401422 Capsular glaucoma with pseudoexfoliation of lens, left eye, moderate stage: Secondary | ICD-10-CM | POA: Diagnosis not present

## 2021-02-16 DIAGNOSIS — H0102A Squamous blepharitis right eye, upper and lower eyelids: Secondary | ICD-10-CM | POA: Diagnosis not present

## 2021-02-16 DIAGNOSIS — H0102B Squamous blepharitis left eye, upper and lower eyelids: Secondary | ICD-10-CM | POA: Diagnosis not present

## 2021-02-16 DIAGNOSIS — Z961 Presence of intraocular lens: Secondary | ICD-10-CM | POA: Diagnosis not present

## 2021-02-16 DIAGNOSIS — H04123 Dry eye syndrome of bilateral lacrimal glands: Secondary | ICD-10-CM | POA: Diagnosis not present

## 2021-02-16 DIAGNOSIS — H40011 Open angle with borderline findings, low risk, right eye: Secondary | ICD-10-CM | POA: Diagnosis not present

## 2021-02-24 DIAGNOSIS — M48061 Spinal stenosis, lumbar region without neurogenic claudication: Secondary | ICD-10-CM | POA: Diagnosis not present

## 2021-02-27 DIAGNOSIS — Z299 Encounter for prophylactic measures, unspecified: Secondary | ICD-10-CM | POA: Diagnosis not present

## 2021-02-27 DIAGNOSIS — R6889 Other general symptoms and signs: Secondary | ICD-10-CM | POA: Diagnosis not present

## 2021-02-27 DIAGNOSIS — I10 Essential (primary) hypertension: Secondary | ICD-10-CM | POA: Diagnosis not present

## 2021-02-27 DIAGNOSIS — J069 Acute upper respiratory infection, unspecified: Secondary | ICD-10-CM | POA: Diagnosis not present

## 2021-03-06 DIAGNOSIS — Z1231 Encounter for screening mammogram for malignant neoplasm of breast: Secondary | ICD-10-CM | POA: Diagnosis not present

## 2021-03-14 DIAGNOSIS — M48061 Spinal stenosis, lumbar region without neurogenic claudication: Secondary | ICD-10-CM | POA: Diagnosis not present

## 2021-03-16 DIAGNOSIS — Z789 Other specified health status: Secondary | ICD-10-CM | POA: Diagnosis not present

## 2021-03-16 DIAGNOSIS — Z299 Encounter for prophylactic measures, unspecified: Secondary | ICD-10-CM | POA: Diagnosis not present

## 2021-03-16 DIAGNOSIS — I10 Essential (primary) hypertension: Secondary | ICD-10-CM | POA: Diagnosis not present

## 2021-03-16 DIAGNOSIS — M810 Age-related osteoporosis without current pathological fracture: Secondary | ICD-10-CM | POA: Diagnosis not present

## 2021-04-20 ENCOUNTER — Ambulatory Visit: Payer: Medicare Other | Admitting: Physician Assistant

## 2021-05-12 DIAGNOSIS — M1389 Other specified arthritis, multiple sites: Secondary | ICD-10-CM | POA: Diagnosis not present

## 2021-05-12 DIAGNOSIS — K5900 Constipation, unspecified: Secondary | ICD-10-CM | POA: Diagnosis not present

## 2021-05-12 DIAGNOSIS — W19XXXA Unspecified fall, initial encounter: Secondary | ICD-10-CM | POA: Diagnosis not present

## 2021-05-12 DIAGNOSIS — Z20822 Contact with and (suspected) exposure to covid-19: Secondary | ICD-10-CM | POA: Diagnosis not present

## 2021-05-12 DIAGNOSIS — R531 Weakness: Secondary | ICD-10-CM | POA: Diagnosis not present

## 2021-05-12 DIAGNOSIS — M1990 Unspecified osteoarthritis, unspecified site: Secondary | ICD-10-CM | POA: Diagnosis not present

## 2021-05-12 DIAGNOSIS — W1830XA Fall on same level, unspecified, initial encounter: Secondary | ICD-10-CM | POA: Diagnosis not present

## 2021-05-12 DIAGNOSIS — S7292XD Unspecified fracture of left femur, subsequent encounter for closed fracture with routine healing: Secondary | ICD-10-CM | POA: Diagnosis not present

## 2021-05-12 DIAGNOSIS — S72002A Fracture of unspecified part of neck of left femur, initial encounter for closed fracture: Secondary | ICD-10-CM | POA: Diagnosis not present

## 2021-05-12 DIAGNOSIS — M25552 Pain in left hip: Secondary | ICD-10-CM | POA: Diagnosis not present

## 2021-05-12 DIAGNOSIS — M5136 Other intervertebral disc degeneration, lumbar region: Secondary | ICD-10-CM | POA: Diagnosis not present

## 2021-05-12 DIAGNOSIS — E876 Hypokalemia: Secondary | ICD-10-CM | POA: Diagnosis not present

## 2021-05-12 DIAGNOSIS — K219 Gastro-esophageal reflux disease without esophagitis: Secondary | ICD-10-CM | POA: Diagnosis not present

## 2021-05-12 DIAGNOSIS — Z96641 Presence of right artificial hip joint: Secondary | ICD-10-CM | POA: Diagnosis not present

## 2021-05-12 DIAGNOSIS — U071 COVID-19: Secondary | ICD-10-CM | POA: Diagnosis not present

## 2021-05-12 DIAGNOSIS — H409 Unspecified glaucoma: Secondary | ICD-10-CM | POA: Diagnosis not present

## 2021-05-12 DIAGNOSIS — E785 Hyperlipidemia, unspecified: Secondary | ICD-10-CM | POA: Diagnosis not present

## 2021-05-12 DIAGNOSIS — E039 Hypothyroidism, unspecified: Secondary | ICD-10-CM | POA: Diagnosis not present

## 2021-05-12 DIAGNOSIS — E079 Disorder of thyroid, unspecified: Secondary | ICD-10-CM | POA: Diagnosis not present

## 2021-05-12 DIAGNOSIS — M810 Age-related osteoporosis without current pathological fracture: Secondary | ICD-10-CM | POA: Diagnosis not present

## 2021-05-12 DIAGNOSIS — D62 Acute posthemorrhagic anemia: Secondary | ICD-10-CM | POA: Diagnosis not present

## 2021-05-12 DIAGNOSIS — S72142A Displaced intertrochanteric fracture of left femur, initial encounter for closed fracture: Secondary | ICD-10-CM | POA: Diagnosis not present

## 2021-05-12 DIAGNOSIS — Z79899 Other long term (current) drug therapy: Secondary | ICD-10-CM | POA: Diagnosis not present

## 2021-05-12 DIAGNOSIS — E871 Hypo-osmolality and hyponatremia: Secondary | ICD-10-CM | POA: Diagnosis not present

## 2021-05-12 DIAGNOSIS — R262 Difficulty in walking, not elsewhere classified: Secondary | ICD-10-CM | POA: Diagnosis not present

## 2021-05-12 DIAGNOSIS — I10 Essential (primary) hypertension: Secondary | ICD-10-CM | POA: Diagnosis not present

## 2021-05-12 DIAGNOSIS — Z1159 Encounter for screening for other viral diseases: Secondary | ICD-10-CM | POA: Diagnosis not present

## 2021-05-12 DIAGNOSIS — Z4789 Encounter for other orthopedic aftercare: Secondary | ICD-10-CM | POA: Diagnosis not present

## 2021-05-12 DIAGNOSIS — W0110XA Fall on same level from slipping, tripping and stumbling with subsequent striking against unspecified object, initial encounter: Secondary | ICD-10-CM | POA: Diagnosis not present

## 2021-05-12 DIAGNOSIS — Z01811 Encounter for preprocedural respiratory examination: Secondary | ICD-10-CM | POA: Diagnosis not present

## 2021-05-12 DIAGNOSIS — S72145A Nondisplaced intertrochanteric fracture of left femur, initial encounter for closed fracture: Secondary | ICD-10-CM | POA: Diagnosis not present

## 2021-05-12 DIAGNOSIS — J302 Other seasonal allergic rhinitis: Secondary | ICD-10-CM | POA: Diagnosis not present

## 2021-05-12 DIAGNOSIS — M6281 Muscle weakness (generalized): Secondary | ICD-10-CM | POA: Diagnosis not present

## 2021-05-12 DIAGNOSIS — Z9181 History of falling: Secondary | ICD-10-CM | POA: Diagnosis not present

## 2021-05-12 DIAGNOSIS — R293 Abnormal posture: Secondary | ICD-10-CM | POA: Diagnosis not present

## 2021-05-17 DIAGNOSIS — R0981 Nasal congestion: Secondary | ICD-10-CM | POA: Diagnosis not present

## 2021-05-17 DIAGNOSIS — H409 Unspecified glaucoma: Secondary | ICD-10-CM | POA: Diagnosis not present

## 2021-05-17 DIAGNOSIS — R293 Abnormal posture: Secondary | ICD-10-CM | POA: Diagnosis not present

## 2021-05-17 DIAGNOSIS — Z4802 Encounter for removal of sutures: Secondary | ICD-10-CM | POA: Diagnosis not present

## 2021-05-17 DIAGNOSIS — E785 Hyperlipidemia, unspecified: Secondary | ICD-10-CM | POA: Diagnosis not present

## 2021-05-17 DIAGNOSIS — S72142A Displaced intertrochanteric fracture of left femur, initial encounter for closed fracture: Secondary | ICD-10-CM | POA: Diagnosis not present

## 2021-05-17 DIAGNOSIS — D62 Acute posthemorrhagic anemia: Secondary | ICD-10-CM | POA: Diagnosis not present

## 2021-05-17 DIAGNOSIS — I10 Essential (primary) hypertension: Secondary | ICD-10-CM | POA: Diagnosis not present

## 2021-05-17 DIAGNOSIS — M5136 Other intervertebral disc degeneration, lumbar region: Secondary | ICD-10-CM | POA: Diagnosis not present

## 2021-05-17 DIAGNOSIS — R059 Cough, unspecified: Secondary | ICD-10-CM | POA: Diagnosis not present

## 2021-05-17 DIAGNOSIS — S71002A Unspecified open wound, left hip, initial encounter: Secondary | ICD-10-CM | POA: Diagnosis not present

## 2021-05-17 DIAGNOSIS — W19XXXA Unspecified fall, initial encounter: Secondary | ICD-10-CM | POA: Diagnosis not present

## 2021-05-17 DIAGNOSIS — R262 Difficulty in walking, not elsewhere classified: Secondary | ICD-10-CM | POA: Diagnosis not present

## 2021-05-17 DIAGNOSIS — Z4889 Encounter for other specified surgical aftercare: Secondary | ICD-10-CM | POA: Diagnosis not present

## 2021-05-17 DIAGNOSIS — E871 Hypo-osmolality and hyponatremia: Secondary | ICD-10-CM | POA: Diagnosis not present

## 2021-05-17 DIAGNOSIS — E039 Hypothyroidism, unspecified: Secondary | ICD-10-CM | POA: Diagnosis not present

## 2021-05-17 DIAGNOSIS — K219 Gastro-esophageal reflux disease without esophagitis: Secondary | ICD-10-CM | POA: Diagnosis not present

## 2021-05-17 DIAGNOSIS — M4185 Other forms of scoliosis, thoracolumbar region: Secondary | ICD-10-CM | POA: Diagnosis not present

## 2021-05-17 DIAGNOSIS — M810 Age-related osteoporosis without current pathological fracture: Secondary | ICD-10-CM | POA: Diagnosis not present

## 2021-05-17 DIAGNOSIS — S72142D Displaced intertrochanteric fracture of left femur, subsequent encounter for closed fracture with routine healing: Secondary | ICD-10-CM | POA: Diagnosis not present

## 2021-05-17 DIAGNOSIS — S72114D Nondisplaced fracture of greater trochanter of right femur, subsequent encounter for closed fracture with routine healing: Secondary | ICD-10-CM | POA: Diagnosis not present

## 2021-05-17 DIAGNOSIS — E876 Hypokalemia: Secondary | ICD-10-CM | POA: Diagnosis not present

## 2021-05-17 DIAGNOSIS — U071 COVID-19: Secondary | ICD-10-CM | POA: Diagnosis not present

## 2021-05-17 DIAGNOSIS — M6281 Muscle weakness (generalized): Secondary | ICD-10-CM | POA: Diagnosis not present

## 2021-05-17 DIAGNOSIS — Z9181 History of falling: Secondary | ICD-10-CM | POA: Diagnosis not present

## 2021-05-17 DIAGNOSIS — Z1159 Encounter for screening for other viral diseases: Secondary | ICD-10-CM | POA: Diagnosis not present

## 2021-05-17 DIAGNOSIS — M1389 Other specified arthritis, multiple sites: Secondary | ICD-10-CM | POA: Diagnosis not present

## 2021-05-17 DIAGNOSIS — S71002D Unspecified open wound, left hip, subsequent encounter: Secondary | ICD-10-CM | POA: Diagnosis not present

## 2021-05-17 DIAGNOSIS — Z4789 Encounter for other orthopedic aftercare: Secondary | ICD-10-CM | POA: Diagnosis not present

## 2021-05-17 DIAGNOSIS — S7292XD Unspecified fracture of left femur, subsequent encounter for closed fracture with routine healing: Secondary | ICD-10-CM | POA: Diagnosis not present

## 2021-05-17 DIAGNOSIS — E079 Disorder of thyroid, unspecified: Secondary | ICD-10-CM | POA: Diagnosis not present

## 2021-05-17 DIAGNOSIS — E119 Type 2 diabetes mellitus without complications: Secondary | ICD-10-CM | POA: Diagnosis not present

## 2021-05-17 DIAGNOSIS — R531 Weakness: Secondary | ICD-10-CM | POA: Diagnosis not present

## 2021-05-17 DIAGNOSIS — J984 Other disorders of lung: Secondary | ICD-10-CM | POA: Diagnosis not present

## 2021-05-18 DIAGNOSIS — Z4789 Encounter for other orthopedic aftercare: Secondary | ICD-10-CM | POA: Diagnosis not present

## 2021-05-18 DIAGNOSIS — S71002A Unspecified open wound, left hip, initial encounter: Secondary | ICD-10-CM | POA: Diagnosis not present

## 2021-05-18 DIAGNOSIS — M6281 Muscle weakness (generalized): Secondary | ICD-10-CM | POA: Diagnosis not present

## 2021-05-19 DIAGNOSIS — Z4789 Encounter for other orthopedic aftercare: Secondary | ICD-10-CM | POA: Diagnosis not present

## 2021-05-19 DIAGNOSIS — Z9181 History of falling: Secondary | ICD-10-CM | POA: Diagnosis not present

## 2021-05-19 DIAGNOSIS — M6281 Muscle weakness (generalized): Secondary | ICD-10-CM | POA: Diagnosis not present

## 2021-05-19 DIAGNOSIS — S7292XD Unspecified fracture of left femur, subsequent encounter for closed fracture with routine healing: Secondary | ICD-10-CM | POA: Diagnosis not present

## 2021-05-22 DIAGNOSIS — M6281 Muscle weakness (generalized): Secondary | ICD-10-CM | POA: Diagnosis not present

## 2021-05-22 DIAGNOSIS — Z9181 History of falling: Secondary | ICD-10-CM | POA: Diagnosis not present

## 2021-05-22 DIAGNOSIS — Z4789 Encounter for other orthopedic aftercare: Secondary | ICD-10-CM | POA: Diagnosis not present

## 2021-05-22 DIAGNOSIS — S7292XD Unspecified fracture of left femur, subsequent encounter for closed fracture with routine healing: Secondary | ICD-10-CM | POA: Diagnosis not present

## 2021-05-23 DIAGNOSIS — M6281 Muscle weakness (generalized): Secondary | ICD-10-CM | POA: Diagnosis not present

## 2021-05-23 DIAGNOSIS — I10 Essential (primary) hypertension: Secondary | ICD-10-CM | POA: Diagnosis not present

## 2021-05-23 DIAGNOSIS — E785 Hyperlipidemia, unspecified: Secondary | ICD-10-CM | POA: Diagnosis not present

## 2021-05-23 DIAGNOSIS — S7292XD Unspecified fracture of left femur, subsequent encounter for closed fracture with routine healing: Secondary | ICD-10-CM | POA: Diagnosis not present

## 2021-05-24 DIAGNOSIS — Z4789 Encounter for other orthopedic aftercare: Secondary | ICD-10-CM | POA: Diagnosis not present

## 2021-05-24 DIAGNOSIS — M6281 Muscle weakness (generalized): Secondary | ICD-10-CM | POA: Diagnosis not present

## 2021-05-24 DIAGNOSIS — S7292XD Unspecified fracture of left femur, subsequent encounter for closed fracture with routine healing: Secondary | ICD-10-CM | POA: Diagnosis not present

## 2021-05-25 DIAGNOSIS — Z4789 Encounter for other orthopedic aftercare: Secondary | ICD-10-CM | POA: Diagnosis not present

## 2021-05-25 DIAGNOSIS — M6281 Muscle weakness (generalized): Secondary | ICD-10-CM | POA: Diagnosis not present

## 2021-05-25 DIAGNOSIS — S71002D Unspecified open wound, left hip, subsequent encounter: Secondary | ICD-10-CM | POA: Diagnosis not present

## 2021-05-26 DIAGNOSIS — E119 Type 2 diabetes mellitus without complications: Secondary | ICD-10-CM | POA: Diagnosis not present

## 2021-05-26 DIAGNOSIS — Z4789 Encounter for other orthopedic aftercare: Secondary | ICD-10-CM | POA: Diagnosis not present

## 2021-05-26 DIAGNOSIS — M6281 Muscle weakness (generalized): Secondary | ICD-10-CM | POA: Diagnosis not present

## 2021-05-26 DIAGNOSIS — S7292XD Unspecified fracture of left femur, subsequent encounter for closed fracture with routine healing: Secondary | ICD-10-CM | POA: Diagnosis not present

## 2021-05-29 DIAGNOSIS — S7292XD Unspecified fracture of left femur, subsequent encounter for closed fracture with routine healing: Secondary | ICD-10-CM | POA: Diagnosis not present

## 2021-05-29 DIAGNOSIS — Z4789 Encounter for other orthopedic aftercare: Secondary | ICD-10-CM | POA: Diagnosis not present

## 2021-05-29 DIAGNOSIS — M6281 Muscle weakness (generalized): Secondary | ICD-10-CM | POA: Diagnosis not present

## 2021-05-30 DIAGNOSIS — Z4802 Encounter for removal of sutures: Secondary | ICD-10-CM | POA: Diagnosis not present

## 2021-05-30 DIAGNOSIS — S7292XD Unspecified fracture of left femur, subsequent encounter for closed fracture with routine healing: Secondary | ICD-10-CM | POA: Diagnosis not present

## 2021-05-30 DIAGNOSIS — Z4789 Encounter for other orthopedic aftercare: Secondary | ICD-10-CM | POA: Diagnosis not present

## 2021-06-01 DIAGNOSIS — S7292XD Unspecified fracture of left femur, subsequent encounter for closed fracture with routine healing: Secondary | ICD-10-CM | POA: Diagnosis not present

## 2021-06-01 DIAGNOSIS — Z4789 Encounter for other orthopedic aftercare: Secondary | ICD-10-CM | POA: Diagnosis not present

## 2021-06-01 DIAGNOSIS — M6281 Muscle weakness (generalized): Secondary | ICD-10-CM | POA: Diagnosis not present

## 2021-06-05 DIAGNOSIS — U071 COVID-19: Secondary | ICD-10-CM | POA: Diagnosis not present

## 2021-06-05 DIAGNOSIS — M6281 Muscle weakness (generalized): Secondary | ICD-10-CM | POA: Diagnosis not present

## 2021-06-05 DIAGNOSIS — I10 Essential (primary) hypertension: Secondary | ICD-10-CM | POA: Diagnosis not present

## 2021-06-06 DIAGNOSIS — U071 COVID-19: Secondary | ICD-10-CM | POA: Diagnosis not present

## 2021-06-06 DIAGNOSIS — M6281 Muscle weakness (generalized): Secondary | ICD-10-CM | POA: Diagnosis not present

## 2021-06-07 DIAGNOSIS — R0981 Nasal congestion: Secondary | ICD-10-CM | POA: Diagnosis not present

## 2021-06-07 DIAGNOSIS — U071 COVID-19: Secondary | ICD-10-CM | POA: Diagnosis not present

## 2021-06-07 DIAGNOSIS — M6281 Muscle weakness (generalized): Secondary | ICD-10-CM | POA: Diagnosis not present

## 2021-06-08 DIAGNOSIS — R0981 Nasal congestion: Secondary | ICD-10-CM | POA: Diagnosis not present

## 2021-06-08 DIAGNOSIS — U071 COVID-19: Secondary | ICD-10-CM | POA: Diagnosis not present

## 2021-06-12 DIAGNOSIS — U071 COVID-19: Secondary | ICD-10-CM | POA: Diagnosis not present

## 2021-06-12 DIAGNOSIS — R0981 Nasal congestion: Secondary | ICD-10-CM | POA: Diagnosis not present

## 2021-06-12 DIAGNOSIS — R059 Cough, unspecified: Secondary | ICD-10-CM | POA: Diagnosis not present

## 2021-06-13 DIAGNOSIS — M6281 Muscle weakness (generalized): Secondary | ICD-10-CM | POA: Diagnosis not present

## 2021-06-13 DIAGNOSIS — R0981 Nasal congestion: Secondary | ICD-10-CM | POA: Diagnosis not present

## 2021-06-13 DIAGNOSIS — U071 COVID-19: Secondary | ICD-10-CM | POA: Diagnosis not present

## 2021-06-14 DIAGNOSIS — U071 COVID-19: Secondary | ICD-10-CM | POA: Diagnosis not present

## 2021-06-14 DIAGNOSIS — M6281 Muscle weakness (generalized): Secondary | ICD-10-CM | POA: Diagnosis not present

## 2021-06-14 DIAGNOSIS — R0981 Nasal congestion: Secondary | ICD-10-CM | POA: Diagnosis not present

## 2021-06-15 DIAGNOSIS — S72142D Displaced intertrochanteric fracture of left femur, subsequent encounter for closed fracture with routine healing: Secondary | ICD-10-CM | POA: Diagnosis not present

## 2021-06-15 DIAGNOSIS — Z4889 Encounter for other specified surgical aftercare: Secondary | ICD-10-CM | POA: Diagnosis not present

## 2021-06-16 DIAGNOSIS — U071 COVID-19: Secondary | ICD-10-CM | POA: Diagnosis not present

## 2021-06-16 DIAGNOSIS — I10 Essential (primary) hypertension: Secondary | ICD-10-CM | POA: Diagnosis not present

## 2021-06-16 DIAGNOSIS — M6281 Muscle weakness (generalized): Secondary | ICD-10-CM | POA: Diagnosis not present

## 2021-06-19 DIAGNOSIS — U071 COVID-19: Secondary | ICD-10-CM | POA: Diagnosis not present

## 2021-06-19 DIAGNOSIS — S7292XD Unspecified fracture of left femur, subsequent encounter for closed fracture with routine healing: Secondary | ICD-10-CM | POA: Diagnosis not present

## 2021-06-20 DIAGNOSIS — Z9181 History of falling: Secondary | ICD-10-CM | POA: Diagnosis not present

## 2021-06-20 DIAGNOSIS — E785 Hyperlipidemia, unspecified: Secondary | ICD-10-CM | POA: Diagnosis not present

## 2021-06-20 DIAGNOSIS — S72114D Nondisplaced fracture of greater trochanter of right femur, subsequent encounter for closed fracture with routine healing: Secondary | ICD-10-CM | POA: Diagnosis not present

## 2021-06-20 DIAGNOSIS — D62 Acute posthemorrhagic anemia: Secondary | ICD-10-CM | POA: Diagnosis not present

## 2021-06-20 DIAGNOSIS — Z4789 Encounter for other orthopedic aftercare: Secondary | ICD-10-CM | POA: Diagnosis not present

## 2021-06-20 DIAGNOSIS — M6281 Muscle weakness (generalized): Secondary | ICD-10-CM | POA: Diagnosis not present

## 2021-06-20 DIAGNOSIS — S7292XD Unspecified fracture of left femur, subsequent encounter for closed fracture with routine healing: Secondary | ICD-10-CM | POA: Diagnosis not present

## 2021-06-20 DIAGNOSIS — E119 Type 2 diabetes mellitus without complications: Secondary | ICD-10-CM | POA: Diagnosis not present

## 2021-06-20 DIAGNOSIS — I10 Essential (primary) hypertension: Secondary | ICD-10-CM | POA: Diagnosis not present

## 2021-06-20 DIAGNOSIS — U071 COVID-19: Secondary | ICD-10-CM | POA: Diagnosis not present

## 2021-06-21 DIAGNOSIS — M6281 Muscle weakness (generalized): Secondary | ICD-10-CM | POA: Diagnosis not present

## 2021-06-23 DIAGNOSIS — M6281 Muscle weakness (generalized): Secondary | ICD-10-CM | POA: Diagnosis not present

## 2021-06-23 DIAGNOSIS — E785 Hyperlipidemia, unspecified: Secondary | ICD-10-CM | POA: Diagnosis not present

## 2021-06-23 DIAGNOSIS — Z9181 History of falling: Secondary | ICD-10-CM | POA: Diagnosis not present

## 2021-06-23 DIAGNOSIS — E119 Type 2 diabetes mellitus without complications: Secondary | ICD-10-CM | POA: Diagnosis not present

## 2021-06-23 DIAGNOSIS — S7292XD Unspecified fracture of left femur, subsequent encounter for closed fracture with routine healing: Secondary | ICD-10-CM | POA: Diagnosis not present

## 2021-06-23 DIAGNOSIS — Z4789 Encounter for other orthopedic aftercare: Secondary | ICD-10-CM | POA: Diagnosis not present

## 2021-06-23 DIAGNOSIS — I10 Essential (primary) hypertension: Secondary | ICD-10-CM | POA: Diagnosis not present

## 2021-06-26 DIAGNOSIS — M6281 Muscle weakness (generalized): Secondary | ICD-10-CM | POA: Diagnosis not present

## 2021-06-26 DIAGNOSIS — Z9181 History of falling: Secondary | ICD-10-CM | POA: Diagnosis not present

## 2021-06-26 DIAGNOSIS — I10 Essential (primary) hypertension: Secondary | ICD-10-CM | POA: Diagnosis not present

## 2021-06-26 DIAGNOSIS — S7292XD Unspecified fracture of left femur, subsequent encounter for closed fracture with routine healing: Secondary | ICD-10-CM | POA: Diagnosis not present

## 2021-06-28 DIAGNOSIS — I10 Essential (primary) hypertension: Secondary | ICD-10-CM | POA: Diagnosis not present

## 2021-06-28 DIAGNOSIS — M6281 Muscle weakness (generalized): Secondary | ICD-10-CM | POA: Diagnosis not present

## 2021-06-30 DIAGNOSIS — E785 Hyperlipidemia, unspecified: Secondary | ICD-10-CM | POA: Diagnosis not present

## 2021-06-30 DIAGNOSIS — U071 COVID-19: Secondary | ICD-10-CM | POA: Diagnosis not present

## 2021-06-30 DIAGNOSIS — R059 Cough, unspecified: Secondary | ICD-10-CM | POA: Diagnosis not present

## 2021-06-30 DIAGNOSIS — M6281 Muscle weakness (generalized): Secondary | ICD-10-CM | POA: Diagnosis not present

## 2021-06-30 DIAGNOSIS — R0981 Nasal congestion: Secondary | ICD-10-CM | POA: Diagnosis not present

## 2021-06-30 DIAGNOSIS — Z9181 History of falling: Secondary | ICD-10-CM | POA: Diagnosis not present

## 2021-06-30 DIAGNOSIS — S7292XD Unspecified fracture of left femur, subsequent encounter for closed fracture with routine healing: Secondary | ICD-10-CM | POA: Diagnosis not present

## 2021-06-30 DIAGNOSIS — I10 Essential (primary) hypertension: Secondary | ICD-10-CM | POA: Diagnosis not present

## 2021-06-30 DIAGNOSIS — Z4789 Encounter for other orthopedic aftercare: Secondary | ICD-10-CM | POA: Diagnosis not present

## 2021-06-30 DIAGNOSIS — E119 Type 2 diabetes mellitus without complications: Secondary | ICD-10-CM | POA: Diagnosis not present

## 2021-06-30 DIAGNOSIS — D62 Acute posthemorrhagic anemia: Secondary | ICD-10-CM | POA: Diagnosis not present

## 2021-07-21 DIAGNOSIS — S72142D Displaced intertrochanteric fracture of left femur, subsequent encounter for closed fracture with routine healing: Secondary | ICD-10-CM | POA: Diagnosis not present

## 2021-07-21 DIAGNOSIS — Z4889 Encounter for other specified surgical aftercare: Secondary | ICD-10-CM | POA: Diagnosis not present

## 2021-07-28 DIAGNOSIS — E8940 Asymptomatic postprocedural ovarian failure: Secondary | ICD-10-CM | POA: Diagnosis not present

## 2021-08-01 DIAGNOSIS — I10 Essential (primary) hypertension: Secondary | ICD-10-CM | POA: Diagnosis not present

## 2021-08-01 DIAGNOSIS — Z299 Encounter for prophylactic measures, unspecified: Secondary | ICD-10-CM | POA: Diagnosis not present

## 2021-08-01 DIAGNOSIS — R21 Rash and other nonspecific skin eruption: Secondary | ICD-10-CM | POA: Diagnosis not present

## 2021-08-03 DIAGNOSIS — M810 Age-related osteoporosis without current pathological fracture: Secondary | ICD-10-CM | POA: Diagnosis not present

## 2021-08-17 ENCOUNTER — Encounter: Payer: Self-pay | Admitting: Physician Assistant

## 2021-08-17 ENCOUNTER — Ambulatory Visit (INDEPENDENT_AMBULATORY_CARE_PROVIDER_SITE_OTHER): Payer: Medicare Other | Admitting: Physician Assistant

## 2021-08-17 DIAGNOSIS — L089 Local infection of the skin and subcutaneous tissue, unspecified: Secondary | ICD-10-CM | POA: Diagnosis not present

## 2021-08-17 DIAGNOSIS — D485 Neoplasm of uncertain behavior of skin: Secondary | ICD-10-CM | POA: Diagnosis not present

## 2021-08-17 DIAGNOSIS — L309 Dermatitis, unspecified: Secondary | ICD-10-CM | POA: Diagnosis not present

## 2021-08-17 NOTE — Patient Instructions (Signed)

## 2021-08-18 DIAGNOSIS — S72142D Displaced intertrochanteric fracture of left femur, subsequent encounter for closed fracture with routine healing: Secondary | ICD-10-CM | POA: Diagnosis not present

## 2021-08-18 DIAGNOSIS — R21 Rash and other nonspecific skin eruption: Secondary | ICD-10-CM | POA: Diagnosis not present

## 2021-08-18 DIAGNOSIS — Z4889 Encounter for other specified surgical aftercare: Secondary | ICD-10-CM | POA: Diagnosis not present

## 2021-08-21 ENCOUNTER — Telehealth: Payer: Self-pay | Admitting: Physician Assistant

## 2021-08-21 NOTE — Telephone Encounter (Signed)
Phone call to patient to inform her that her results aren't back yet and once they come in [place] will review them and we will get back with her.

## 2021-08-21 NOTE — Telephone Encounter (Signed)
Results, KRS 

## 2021-08-23 LAB — ANAEROBIC AND AEROBIC CULTURE
MICRO NUMBER:: 13642921
MICRO NUMBER:: 13642922
SPECIMEN QUALITY:: ADEQUATE
SPECIMEN QUALITY:: ADEQUATE

## 2021-08-24 ENCOUNTER — Telehealth: Payer: Self-pay | Admitting: Physician Assistant

## 2021-08-24 ENCOUNTER — Encounter: Payer: Self-pay | Admitting: Physician Assistant

## 2021-08-24 NOTE — Telephone Encounter (Signed)
Phone call to patient to let her know Robin Chambers has not reviewed path. Once she does we will call patient with next step.

## 2021-08-24 NOTE — Telephone Encounter (Signed)
Patient is calling for pathology results from last visit with [facility], PA-C.

## 2021-08-24 NOTE — Progress Notes (Signed)
   Follow-Up Visit   Subjective  Robin Chambers is a 81 y.o. female who presents for the following: Rash (Rash started on the right groin and now its from her neck down. Treatment from primary care Triamcinolone. Apri 7th broke hip.  Hip replacement April 10th.  April 12- may 29 - 46 days in rehab center. April 30th-may 10th- diagnosed with COVID -zpak given. Last roommate may 14-28th 14 days then first itchy lesion appeared. Then came home for 6 weeks. 4th week kenalog injection given for itching. June 27th gave triamcinolone cream. June 29th got Prolia injection. Stopped using Voltaren on June 25th. ).   The following portions of the chart were reviewed this encounter and updated as appropriate:  Tobacco  Allergies  Meds  Problems  Med Hx  Surg Hx  Fam Hx      Objective  Well appearing patient in no apparent distress; mood and affect are within normal limits.  A full examination was performed including scalp, head, eyes, ears, nose, lips, neck, chest, axillae, abdomen, back, buttocks, bilateral upper extremities, bilateral lower extremities, hands, feet, fingers, toes, fingernails, and toenails. All findings within normal limits unless otherwise noted below.  Right Upper Arm - Anterior & abdomen Round pink macule with scale.    Assessment & Plan  Infection of skin and subcutaneous tissue Left Abdomen (side) - Upper  Anaerobic and Aerobic Culture - Left Abdomen (side) - Upper  Neoplasm of uncertain behavior of skin Right Upper Arm - Anterior & abdomen  Skin / nail biopsy Type of biopsy: tangential   Informed consent: discussed and consent obtained   Timeout: patient name, date of birth, surgical site, and procedure verified   Procedure prep:  Patient was prepped and draped in usual sterile fashion (Non sterile) Prep type:  Chlorhexidine Anesthesia: the lesion was anesthetized in a standard fashion   Anesthetic:  1% lidocaine w/ epinephrine 1-100,000 local  infiltration Instrument used: flexible razor blade   Outcome: patient tolerated procedure well   Post-procedure details: wound care instructions given    Specimen 1 - Surgical pathology Differential Diagnosis:(2 specimens)bite reaction , drug eruption  Check Margins: No    I, Lashante Fryberger, PA-C, have reviewed all documentation's for this visit.  The documentation on 08/24/21 for the exam, diagnosis, procedures and orders are all accurate and complete.

## 2021-08-28 ENCOUNTER — Telehealth: Payer: Self-pay | Admitting: Physician Assistant

## 2021-08-28 DIAGNOSIS — Z789 Other specified health status: Secondary | ICD-10-CM | POA: Diagnosis not present

## 2021-08-28 DIAGNOSIS — Z299 Encounter for prophylactic measures, unspecified: Secondary | ICD-10-CM | POA: Diagnosis not present

## 2021-08-28 DIAGNOSIS — I10 Essential (primary) hypertension: Secondary | ICD-10-CM | POA: Diagnosis not present

## 2021-08-28 DIAGNOSIS — L239 Allergic contact dermatitis, unspecified cause: Secondary | ICD-10-CM | POA: Diagnosis not present

## 2021-08-28 MED ORDER — CLOBETASOL PROP EMOLLIENT BASE 0.05 % EX CREA
1.0000 | TOPICAL_CREAM | Freq: Two times a day (BID) | CUTANEOUS | 2 refills | Status: AC
Start: 2021-08-28 — End: ?

## 2021-08-28 NOTE — Telephone Encounter (Signed)
Phone call to patient with her results and Kelli's recommendations. Patient aware.

## 2021-08-28 NOTE — Telephone Encounter (Signed)
Biopsy was done on 13th. "Just about out of my mind with itching and it's getting worse"

## 2021-08-28 NOTE — Telephone Encounter (Signed)
-----   Message from Warren Danes, [place] sent at 08/24/2021  6:45 PM EDT ----- No infection. Pathology report indicated that it could be eczema or a drug reaction. Treat with clobetasol cream x 2-4 weeks. If no improvement lisinopril and timolol are the biggest culprits for causing a drug reaction. Will contact PCP to change lisinopril first if necessary. Ok to give RX 90 grams BID for clobetasol with 2 refills.

## 2021-08-30 DIAGNOSIS — Z789 Other specified health status: Secondary | ICD-10-CM | POA: Diagnosis not present

## 2021-08-30 DIAGNOSIS — R21 Rash and other nonspecific skin eruption: Secondary | ICD-10-CM | POA: Diagnosis not present

## 2021-08-30 DIAGNOSIS — I10 Essential (primary) hypertension: Secondary | ICD-10-CM | POA: Diagnosis not present

## 2021-08-30 DIAGNOSIS — Z299 Encounter for prophylactic measures, unspecified: Secondary | ICD-10-CM | POA: Diagnosis not present

## 2021-08-31 ENCOUNTER — Telehealth: Payer: Self-pay | Admitting: Physician Assistant

## 2021-08-31 ENCOUNTER — Ambulatory Visit: Payer: Medicare Other | Admitting: Physician Assistant

## 2021-08-31 NOTE — Telephone Encounter (Signed)
Patient is calling to say that she went to Dr. Woody Seller, her PCP, and they gave her a prednisone shot.   Clobetasol Prop Emollient Base Clobetasol Prop Emollient Base (CLOBETASOL PROPIONATE E) 0.05 % emollient cream Has done nothing for her.  Patient says that she does not need a return call because she was just giving a courtesy call to let [facility], PA-C know what was going on.

## 2021-09-06 ENCOUNTER — Ambulatory Visit: Payer: Medicare Other | Admitting: Physician Assistant

## 2021-09-06 DIAGNOSIS — L308 Other specified dermatitis: Secondary | ICD-10-CM | POA: Diagnosis not present

## 2021-09-06 DIAGNOSIS — B86 Scabies: Secondary | ICD-10-CM | POA: Diagnosis not present

## 2021-09-15 DIAGNOSIS — Z4889 Encounter for other specified surgical aftercare: Secondary | ICD-10-CM | POA: Diagnosis not present

## 2021-09-15 DIAGNOSIS — R21 Rash and other nonspecific skin eruption: Secondary | ICD-10-CM | POA: Diagnosis not present

## 2021-09-15 DIAGNOSIS — S72142D Displaced intertrochanteric fracture of left femur, subsequent encounter for closed fracture with routine healing: Secondary | ICD-10-CM | POA: Diagnosis not present

## 2021-09-21 DIAGNOSIS — M6281 Muscle weakness (generalized): Secondary | ICD-10-CM | POA: Diagnosis not present

## 2021-09-21 DIAGNOSIS — R2681 Unsteadiness on feet: Secondary | ICD-10-CM | POA: Diagnosis not present

## 2021-09-21 DIAGNOSIS — M25552 Pain in left hip: Secondary | ICD-10-CM | POA: Diagnosis not present

## 2021-09-21 DIAGNOSIS — R2689 Other abnormalities of gait and mobility: Secondary | ICD-10-CM | POA: Diagnosis not present

## 2021-09-21 DIAGNOSIS — S72142D Displaced intertrochanteric fracture of left femur, subsequent encounter for closed fracture with routine healing: Secondary | ICD-10-CM | POA: Diagnosis not present

## 2021-09-25 DIAGNOSIS — M25552 Pain in left hip: Secondary | ICD-10-CM | POA: Diagnosis not present

## 2021-09-25 DIAGNOSIS — S72142D Displaced intertrochanteric fracture of left femur, subsequent encounter for closed fracture with routine healing: Secondary | ICD-10-CM | POA: Diagnosis not present

## 2021-09-25 DIAGNOSIS — R2681 Unsteadiness on feet: Secondary | ICD-10-CM | POA: Diagnosis not present

## 2021-09-25 DIAGNOSIS — M6281 Muscle weakness (generalized): Secondary | ICD-10-CM | POA: Diagnosis not present

## 2021-09-25 DIAGNOSIS — R2689 Other abnormalities of gait and mobility: Secondary | ICD-10-CM | POA: Diagnosis not present

## 2021-09-26 DIAGNOSIS — B86 Scabies: Secondary | ICD-10-CM | POA: Diagnosis not present

## 2021-09-27 DIAGNOSIS — M6281 Muscle weakness (generalized): Secondary | ICD-10-CM | POA: Diagnosis not present

## 2021-09-27 DIAGNOSIS — R2681 Unsteadiness on feet: Secondary | ICD-10-CM | POA: Diagnosis not present

## 2021-09-27 DIAGNOSIS — R2689 Other abnormalities of gait and mobility: Secondary | ICD-10-CM | POA: Diagnosis not present

## 2021-09-27 DIAGNOSIS — S72142D Displaced intertrochanteric fracture of left femur, subsequent encounter for closed fracture with routine healing: Secondary | ICD-10-CM | POA: Diagnosis not present

## 2021-09-27 DIAGNOSIS — M25552 Pain in left hip: Secondary | ICD-10-CM | POA: Diagnosis not present

## 2021-09-29 DIAGNOSIS — M25552 Pain in left hip: Secondary | ICD-10-CM | POA: Diagnosis not present

## 2021-09-29 DIAGNOSIS — R2681 Unsteadiness on feet: Secondary | ICD-10-CM | POA: Diagnosis not present

## 2021-09-29 DIAGNOSIS — R2689 Other abnormalities of gait and mobility: Secondary | ICD-10-CM | POA: Diagnosis not present

## 2021-09-29 DIAGNOSIS — S72142D Displaced intertrochanteric fracture of left femur, subsequent encounter for closed fracture with routine healing: Secondary | ICD-10-CM | POA: Diagnosis not present

## 2021-09-29 DIAGNOSIS — M6281 Muscle weakness (generalized): Secondary | ICD-10-CM | POA: Diagnosis not present

## 2021-10-03 DIAGNOSIS — R2681 Unsteadiness on feet: Secondary | ICD-10-CM | POA: Diagnosis not present

## 2021-10-03 DIAGNOSIS — R2689 Other abnormalities of gait and mobility: Secondary | ICD-10-CM | POA: Diagnosis not present

## 2021-10-03 DIAGNOSIS — M25552 Pain in left hip: Secondary | ICD-10-CM | POA: Diagnosis not present

## 2021-10-03 DIAGNOSIS — M6281 Muscle weakness (generalized): Secondary | ICD-10-CM | POA: Diagnosis not present

## 2021-10-03 DIAGNOSIS — S72142D Displaced intertrochanteric fracture of left femur, subsequent encounter for closed fracture with routine healing: Secondary | ICD-10-CM | POA: Diagnosis not present

## 2021-10-04 DIAGNOSIS — S72142D Displaced intertrochanteric fracture of left femur, subsequent encounter for closed fracture with routine healing: Secondary | ICD-10-CM | POA: Diagnosis not present

## 2021-10-04 DIAGNOSIS — R2689 Other abnormalities of gait and mobility: Secondary | ICD-10-CM | POA: Diagnosis not present

## 2021-10-04 DIAGNOSIS — M25552 Pain in left hip: Secondary | ICD-10-CM | POA: Diagnosis not present

## 2021-10-04 DIAGNOSIS — R2681 Unsteadiness on feet: Secondary | ICD-10-CM | POA: Diagnosis not present

## 2021-10-04 DIAGNOSIS — M6281 Muscle weakness (generalized): Secondary | ICD-10-CM | POA: Diagnosis not present

## 2021-10-06 DIAGNOSIS — S72142D Displaced intertrochanteric fracture of left femur, subsequent encounter for closed fracture with routine healing: Secondary | ICD-10-CM | POA: Diagnosis not present

## 2021-10-06 DIAGNOSIS — M6281 Muscle weakness (generalized): Secondary | ICD-10-CM | POA: Diagnosis not present

## 2021-10-06 DIAGNOSIS — M25552 Pain in left hip: Secondary | ICD-10-CM | POA: Diagnosis not present

## 2021-10-06 DIAGNOSIS — R2689 Other abnormalities of gait and mobility: Secondary | ICD-10-CM | POA: Diagnosis not present

## 2021-10-06 DIAGNOSIS — R2681 Unsteadiness on feet: Secondary | ICD-10-CM | POA: Diagnosis not present

## 2021-10-10 DIAGNOSIS — R2681 Unsteadiness on feet: Secondary | ICD-10-CM | POA: Diagnosis not present

## 2021-10-10 DIAGNOSIS — S72142D Displaced intertrochanteric fracture of left femur, subsequent encounter for closed fracture with routine healing: Secondary | ICD-10-CM | POA: Diagnosis not present

## 2021-10-10 DIAGNOSIS — M25552 Pain in left hip: Secondary | ICD-10-CM | POA: Diagnosis not present

## 2021-10-10 DIAGNOSIS — R2689 Other abnormalities of gait and mobility: Secondary | ICD-10-CM | POA: Diagnosis not present

## 2021-10-10 DIAGNOSIS — B86 Scabies: Secondary | ICD-10-CM | POA: Diagnosis not present

## 2021-10-10 DIAGNOSIS — M6281 Muscle weakness (generalized): Secondary | ICD-10-CM | POA: Diagnosis not present

## 2021-10-11 DIAGNOSIS — S72142D Displaced intertrochanteric fracture of left femur, subsequent encounter for closed fracture with routine healing: Secondary | ICD-10-CM | POA: Diagnosis not present

## 2021-10-11 DIAGNOSIS — M25552 Pain in left hip: Secondary | ICD-10-CM | POA: Diagnosis not present

## 2021-10-11 DIAGNOSIS — R2681 Unsteadiness on feet: Secondary | ICD-10-CM | POA: Diagnosis not present

## 2021-10-11 DIAGNOSIS — R2689 Other abnormalities of gait and mobility: Secondary | ICD-10-CM | POA: Diagnosis not present

## 2021-10-11 DIAGNOSIS — M6281 Muscle weakness (generalized): Secondary | ICD-10-CM | POA: Diagnosis not present

## 2021-10-17 DIAGNOSIS — R2689 Other abnormalities of gait and mobility: Secondary | ICD-10-CM | POA: Diagnosis not present

## 2021-10-17 DIAGNOSIS — M6281 Muscle weakness (generalized): Secondary | ICD-10-CM | POA: Diagnosis not present

## 2021-10-17 DIAGNOSIS — S72142D Displaced intertrochanteric fracture of left femur, subsequent encounter for closed fracture with routine healing: Secondary | ICD-10-CM | POA: Diagnosis not present

## 2021-10-17 DIAGNOSIS — M25552 Pain in left hip: Secondary | ICD-10-CM | POA: Diagnosis not present

## 2021-10-17 DIAGNOSIS — R2681 Unsteadiness on feet: Secondary | ICD-10-CM | POA: Diagnosis not present

## 2021-10-18 DIAGNOSIS — M25552 Pain in left hip: Secondary | ICD-10-CM | POA: Diagnosis not present

## 2021-10-18 DIAGNOSIS — S72142D Displaced intertrochanteric fracture of left femur, subsequent encounter for closed fracture with routine healing: Secondary | ICD-10-CM | POA: Diagnosis not present

## 2021-10-18 DIAGNOSIS — M6281 Muscle weakness (generalized): Secondary | ICD-10-CM | POA: Diagnosis not present

## 2021-10-18 DIAGNOSIS — R2681 Unsteadiness on feet: Secondary | ICD-10-CM | POA: Diagnosis not present

## 2021-10-18 DIAGNOSIS — R2689 Other abnormalities of gait and mobility: Secondary | ICD-10-CM | POA: Diagnosis not present

## 2021-10-24 DIAGNOSIS — S72142D Displaced intertrochanteric fracture of left femur, subsequent encounter for closed fracture with routine healing: Secondary | ICD-10-CM | POA: Diagnosis not present

## 2021-10-24 DIAGNOSIS — M25552 Pain in left hip: Secondary | ICD-10-CM | POA: Diagnosis not present

## 2021-10-24 DIAGNOSIS — M6281 Muscle weakness (generalized): Secondary | ICD-10-CM | POA: Diagnosis not present

## 2021-10-24 DIAGNOSIS — R2681 Unsteadiness on feet: Secondary | ICD-10-CM | POA: Diagnosis not present

## 2021-10-24 DIAGNOSIS — R2689 Other abnormalities of gait and mobility: Secondary | ICD-10-CM | POA: Diagnosis not present

## 2021-10-26 DIAGNOSIS — R2689 Other abnormalities of gait and mobility: Secondary | ICD-10-CM | POA: Diagnosis not present

## 2021-10-26 DIAGNOSIS — R2681 Unsteadiness on feet: Secondary | ICD-10-CM | POA: Diagnosis not present

## 2021-10-26 DIAGNOSIS — M6281 Muscle weakness (generalized): Secondary | ICD-10-CM | POA: Diagnosis not present

## 2021-10-26 DIAGNOSIS — S72142D Displaced intertrochanteric fracture of left femur, subsequent encounter for closed fracture with routine healing: Secondary | ICD-10-CM | POA: Diagnosis not present

## 2021-10-26 DIAGNOSIS — M25552 Pain in left hip: Secondary | ICD-10-CM | POA: Diagnosis not present

## 2021-10-30 DIAGNOSIS — R2681 Unsteadiness on feet: Secondary | ICD-10-CM | POA: Diagnosis not present

## 2021-10-30 DIAGNOSIS — S72142D Displaced intertrochanteric fracture of left femur, subsequent encounter for closed fracture with routine healing: Secondary | ICD-10-CM | POA: Diagnosis not present

## 2021-10-30 DIAGNOSIS — M25552 Pain in left hip: Secondary | ICD-10-CM | POA: Diagnosis not present

## 2021-10-30 DIAGNOSIS — R2689 Other abnormalities of gait and mobility: Secondary | ICD-10-CM | POA: Diagnosis not present

## 2021-10-30 DIAGNOSIS — M6281 Muscle weakness (generalized): Secondary | ICD-10-CM | POA: Diagnosis not present

## 2021-10-31 DIAGNOSIS — Z299 Encounter for prophylactic measures, unspecified: Secondary | ICD-10-CM | POA: Diagnosis not present

## 2021-10-31 DIAGNOSIS — I10 Essential (primary) hypertension: Secondary | ICD-10-CM | POA: Diagnosis not present

## 2021-10-31 DIAGNOSIS — Z789 Other specified health status: Secondary | ICD-10-CM | POA: Diagnosis not present

## 2021-11-07 DIAGNOSIS — M6281 Muscle weakness (generalized): Secondary | ICD-10-CM | POA: Diagnosis not present

## 2021-11-07 DIAGNOSIS — R2689 Other abnormalities of gait and mobility: Secondary | ICD-10-CM | POA: Diagnosis not present

## 2021-11-07 DIAGNOSIS — R2681 Unsteadiness on feet: Secondary | ICD-10-CM | POA: Diagnosis not present

## 2021-11-07 DIAGNOSIS — S72142D Displaced intertrochanteric fracture of left femur, subsequent encounter for closed fracture with routine healing: Secondary | ICD-10-CM | POA: Diagnosis not present

## 2021-11-07 DIAGNOSIS — M25552 Pain in left hip: Secondary | ICD-10-CM | POA: Diagnosis not present

## 2021-11-08 DIAGNOSIS — M25552 Pain in left hip: Secondary | ICD-10-CM | POA: Diagnosis not present

## 2021-11-08 DIAGNOSIS — R2681 Unsteadiness on feet: Secondary | ICD-10-CM | POA: Diagnosis not present

## 2021-11-08 DIAGNOSIS — S72142D Displaced intertrochanteric fracture of left femur, subsequent encounter for closed fracture with routine healing: Secondary | ICD-10-CM | POA: Diagnosis not present

## 2021-11-08 DIAGNOSIS — R2689 Other abnormalities of gait and mobility: Secondary | ICD-10-CM | POA: Diagnosis not present

## 2021-11-08 DIAGNOSIS — M6281 Muscle weakness (generalized): Secondary | ICD-10-CM | POA: Diagnosis not present

## 2021-11-13 DIAGNOSIS — M6281 Muscle weakness (generalized): Secondary | ICD-10-CM | POA: Diagnosis not present

## 2021-11-13 DIAGNOSIS — R2681 Unsteadiness on feet: Secondary | ICD-10-CM | POA: Diagnosis not present

## 2021-11-13 DIAGNOSIS — R2689 Other abnormalities of gait and mobility: Secondary | ICD-10-CM | POA: Diagnosis not present

## 2021-11-13 DIAGNOSIS — S72142D Displaced intertrochanteric fracture of left femur, subsequent encounter for closed fracture with routine healing: Secondary | ICD-10-CM | POA: Diagnosis not present

## 2021-11-13 DIAGNOSIS — M25552 Pain in left hip: Secondary | ICD-10-CM | POA: Diagnosis not present

## 2021-11-15 DIAGNOSIS — Z299 Encounter for prophylactic measures, unspecified: Secondary | ICD-10-CM | POA: Diagnosis not present

## 2021-11-15 DIAGNOSIS — I10 Essential (primary) hypertension: Secondary | ICD-10-CM | POA: Diagnosis not present

## 2021-11-15 DIAGNOSIS — M6281 Muscle weakness (generalized): Secondary | ICD-10-CM | POA: Diagnosis not present

## 2021-11-15 DIAGNOSIS — Z789 Other specified health status: Secondary | ICD-10-CM | POA: Diagnosis not present

## 2021-11-15 DIAGNOSIS — Z23 Encounter for immunization: Secondary | ICD-10-CM | POA: Diagnosis not present

## 2021-11-15 DIAGNOSIS — R2689 Other abnormalities of gait and mobility: Secondary | ICD-10-CM | POA: Diagnosis not present

## 2021-11-15 DIAGNOSIS — S72142D Displaced intertrochanteric fracture of left femur, subsequent encounter for closed fracture with routine healing: Secondary | ICD-10-CM | POA: Diagnosis not present

## 2021-11-15 DIAGNOSIS — R2681 Unsteadiness on feet: Secondary | ICD-10-CM | POA: Diagnosis not present

## 2021-11-15 DIAGNOSIS — M25552 Pain in left hip: Secondary | ICD-10-CM | POA: Diagnosis not present

## 2021-11-20 DIAGNOSIS — R2689 Other abnormalities of gait and mobility: Secondary | ICD-10-CM | POA: Diagnosis not present

## 2021-11-20 DIAGNOSIS — S72142D Displaced intertrochanteric fracture of left femur, subsequent encounter for closed fracture with routine healing: Secondary | ICD-10-CM | POA: Diagnosis not present

## 2021-11-20 DIAGNOSIS — M6281 Muscle weakness (generalized): Secondary | ICD-10-CM | POA: Diagnosis not present

## 2021-11-20 DIAGNOSIS — R2681 Unsteadiness on feet: Secondary | ICD-10-CM | POA: Diagnosis not present

## 2021-11-20 DIAGNOSIS — M25552 Pain in left hip: Secondary | ICD-10-CM | POA: Diagnosis not present

## 2021-11-27 DIAGNOSIS — M25552 Pain in left hip: Secondary | ICD-10-CM | POA: Diagnosis not present

## 2021-11-27 DIAGNOSIS — M6281 Muscle weakness (generalized): Secondary | ICD-10-CM | POA: Diagnosis not present

## 2021-11-27 DIAGNOSIS — R2681 Unsteadiness on feet: Secondary | ICD-10-CM | POA: Diagnosis not present

## 2021-11-27 DIAGNOSIS — R2689 Other abnormalities of gait and mobility: Secondary | ICD-10-CM | POA: Diagnosis not present

## 2021-11-27 DIAGNOSIS — S72142D Displaced intertrochanteric fracture of left femur, subsequent encounter for closed fracture with routine healing: Secondary | ICD-10-CM | POA: Diagnosis not present

## 2021-11-30 DIAGNOSIS — Z299 Encounter for prophylactic measures, unspecified: Secondary | ICD-10-CM | POA: Diagnosis not present

## 2021-11-30 DIAGNOSIS — Z789 Other specified health status: Secondary | ICD-10-CM | POA: Diagnosis not present

## 2021-11-30 DIAGNOSIS — I10 Essential (primary) hypertension: Secondary | ICD-10-CM | POA: Diagnosis not present

## 2021-12-04 DIAGNOSIS — I10 Essential (primary) hypertension: Secondary | ICD-10-CM | POA: Diagnosis not present

## 2021-12-21 DIAGNOSIS — Z0000 Encounter for general adult medical examination without abnormal findings: Secondary | ICD-10-CM | POA: Diagnosis not present

## 2021-12-21 DIAGNOSIS — Z1331 Encounter for screening for depression: Secondary | ICD-10-CM | POA: Diagnosis not present

## 2021-12-21 DIAGNOSIS — Z789 Other specified health status: Secondary | ICD-10-CM | POA: Diagnosis not present

## 2021-12-21 DIAGNOSIS — Z1339 Encounter for screening examination for other mental health and behavioral disorders: Secondary | ICD-10-CM | POA: Diagnosis not present

## 2021-12-21 DIAGNOSIS — R5383 Other fatigue: Secondary | ICD-10-CM | POA: Diagnosis not present

## 2021-12-21 DIAGNOSIS — Z299 Encounter for prophylactic measures, unspecified: Secondary | ICD-10-CM | POA: Diagnosis not present

## 2021-12-21 DIAGNOSIS — Z6828 Body mass index (BMI) 28.0-28.9, adult: Secondary | ICD-10-CM | POA: Diagnosis not present

## 2021-12-21 DIAGNOSIS — E7800 Pure hypercholesterolemia, unspecified: Secondary | ICD-10-CM | POA: Diagnosis not present

## 2021-12-21 DIAGNOSIS — I10 Essential (primary) hypertension: Secondary | ICD-10-CM | POA: Diagnosis not present

## 2021-12-21 DIAGNOSIS — Z79899 Other long term (current) drug therapy: Secondary | ICD-10-CM | POA: Diagnosis not present

## 2021-12-21 DIAGNOSIS — Z7189 Other specified counseling: Secondary | ICD-10-CM | POA: Diagnosis not present

## 2022-01-03 DIAGNOSIS — I10 Essential (primary) hypertension: Secondary | ICD-10-CM | POA: Diagnosis not present

## 2022-01-25 DIAGNOSIS — L821 Other seborrheic keratosis: Secondary | ICD-10-CM | POA: Diagnosis not present

## 2022-02-02 DIAGNOSIS — I10 Essential (primary) hypertension: Secondary | ICD-10-CM | POA: Diagnosis not present

## 2022-02-08 DIAGNOSIS — M810 Age-related osteoporosis without current pathological fracture: Secondary | ICD-10-CM | POA: Diagnosis not present

## 2022-02-21 DIAGNOSIS — H532 Diplopia: Secondary | ICD-10-CM | POA: Diagnosis not present

## 2022-02-21 DIAGNOSIS — H0102A Squamous blepharitis right eye, upper and lower eyelids: Secondary | ICD-10-CM | POA: Diagnosis not present

## 2022-02-21 DIAGNOSIS — H04123 Dry eye syndrome of bilateral lacrimal glands: Secondary | ICD-10-CM | POA: Diagnosis not present

## 2022-02-21 DIAGNOSIS — H5022 Vertical strabismus, left eye: Secondary | ICD-10-CM | POA: Diagnosis not present

## 2022-02-21 DIAGNOSIS — H401421 Capsular glaucoma with pseudoexfoliation of lens, left eye, mild stage: Secondary | ICD-10-CM | POA: Diagnosis not present

## 2022-02-21 DIAGNOSIS — H5021 Vertical strabismus, right eye: Secondary | ICD-10-CM | POA: Diagnosis not present

## 2022-02-21 DIAGNOSIS — Z961 Presence of intraocular lens: Secondary | ICD-10-CM | POA: Diagnosis not present

## 2022-02-21 DIAGNOSIS — H0102B Squamous blepharitis left eye, upper and lower eyelids: Secondary | ICD-10-CM | POA: Diagnosis not present

## 2022-03-05 DIAGNOSIS — I10 Essential (primary) hypertension: Secondary | ICD-10-CM | POA: Diagnosis not present

## 2022-03-06 DIAGNOSIS — Z299 Encounter for prophylactic measures, unspecified: Secondary | ICD-10-CM | POA: Diagnosis not present

## 2022-03-06 DIAGNOSIS — I10 Essential (primary) hypertension: Secondary | ICD-10-CM | POA: Diagnosis not present

## 2022-03-06 DIAGNOSIS — Z6829 Body mass index (BMI) 29.0-29.9, adult: Secondary | ICD-10-CM | POA: Diagnosis not present

## 2022-03-06 DIAGNOSIS — M1990 Unspecified osteoarthritis, unspecified site: Secondary | ICD-10-CM | POA: Diagnosis not present

## 2022-04-04 DIAGNOSIS — I10 Essential (primary) hypertension: Secondary | ICD-10-CM | POA: Diagnosis not present

## 2022-05-05 DIAGNOSIS — I10 Essential (primary) hypertension: Secondary | ICD-10-CM | POA: Diagnosis not present

## 2022-05-17 DIAGNOSIS — L559 Sunburn, unspecified: Secondary | ICD-10-CM | POA: Diagnosis not present

## 2022-05-17 DIAGNOSIS — Z08 Encounter for follow-up examination after completed treatment for malignant neoplasm: Secondary | ICD-10-CM | POA: Diagnosis not present

## 2022-05-17 DIAGNOSIS — L810 Postinflammatory hyperpigmentation: Secondary | ICD-10-CM | POA: Diagnosis not present

## 2022-05-17 DIAGNOSIS — Z85828 Personal history of other malignant neoplasm of skin: Secondary | ICD-10-CM | POA: Diagnosis not present

## 2022-05-17 DIAGNOSIS — L988 Other specified disorders of the skin and subcutaneous tissue: Secondary | ICD-10-CM | POA: Diagnosis not present

## 2022-06-05 DIAGNOSIS — I10 Essential (primary) hypertension: Secondary | ICD-10-CM | POA: Diagnosis not present

## 2022-06-26 DIAGNOSIS — Z299 Encounter for prophylactic measures, unspecified: Secondary | ICD-10-CM | POA: Diagnosis not present

## 2022-06-26 DIAGNOSIS — I10 Essential (primary) hypertension: Secondary | ICD-10-CM | POA: Diagnosis not present

## 2022-06-26 DIAGNOSIS — R5383 Other fatigue: Secondary | ICD-10-CM | POA: Diagnosis not present

## 2022-06-28 DIAGNOSIS — M48061 Spinal stenosis, lumbar region without neurogenic claudication: Secondary | ICD-10-CM | POA: Diagnosis not present

## 2022-07-03 DIAGNOSIS — M545 Low back pain, unspecified: Secondary | ICD-10-CM | POA: Diagnosis not present

## 2022-07-06 DIAGNOSIS — I10 Essential (primary) hypertension: Secondary | ICD-10-CM | POA: Diagnosis not present

## 2022-07-12 DIAGNOSIS — I10 Essential (primary) hypertension: Secondary | ICD-10-CM | POA: Diagnosis not present

## 2022-07-12 DIAGNOSIS — R21 Rash and other nonspecific skin eruption: Secondary | ICD-10-CM | POA: Diagnosis not present

## 2022-07-12 DIAGNOSIS — Z299 Encounter for prophylactic measures, unspecified: Secondary | ICD-10-CM | POA: Diagnosis not present

## 2022-07-12 DIAGNOSIS — W57XXXA Bitten or stung by nonvenomous insect and other nonvenomous arthropods, initial encounter: Secondary | ICD-10-CM | POA: Diagnosis not present

## 2022-08-03 DIAGNOSIS — M545 Low back pain, unspecified: Secondary | ICD-10-CM | POA: Diagnosis not present

## 2022-08-15 DIAGNOSIS — I10 Essential (primary) hypertension: Secondary | ICD-10-CM | POA: Diagnosis not present

## 2022-08-15 DIAGNOSIS — K219 Gastro-esophageal reflux disease without esophagitis: Secondary | ICD-10-CM | POA: Diagnosis not present

## 2022-08-15 DIAGNOSIS — H6123 Impacted cerumen, bilateral: Secondary | ICD-10-CM | POA: Diagnosis not present

## 2022-08-17 DIAGNOSIS — M5416 Radiculopathy, lumbar region: Secondary | ICD-10-CM | POA: Diagnosis not present

## 2022-08-30 DIAGNOSIS — H00021 Hordeolum internum right upper eyelid: Secondary | ICD-10-CM | POA: Diagnosis not present

## 2022-09-03 DIAGNOSIS — M5416 Radiculopathy, lumbar region: Secondary | ICD-10-CM | POA: Diagnosis not present

## 2022-09-05 DIAGNOSIS — M5451 Vertebrogenic low back pain: Secondary | ICD-10-CM | POA: Diagnosis not present

## 2022-09-05 DIAGNOSIS — M6281 Muscle weakness (generalized): Secondary | ICD-10-CM | POA: Diagnosis not present

## 2022-09-05 DIAGNOSIS — R293 Abnormal posture: Secondary | ICD-10-CM | POA: Diagnosis not present

## 2022-09-05 DIAGNOSIS — R2689 Other abnormalities of gait and mobility: Secondary | ICD-10-CM | POA: Diagnosis not present

## 2022-09-07 DIAGNOSIS — M5451 Vertebrogenic low back pain: Secondary | ICD-10-CM | POA: Diagnosis not present

## 2022-09-07 DIAGNOSIS — M6281 Muscle weakness (generalized): Secondary | ICD-10-CM | POA: Diagnosis not present

## 2022-09-07 DIAGNOSIS — R2689 Other abnormalities of gait and mobility: Secondary | ICD-10-CM | POA: Diagnosis not present

## 2022-09-07 DIAGNOSIS — R293 Abnormal posture: Secondary | ICD-10-CM | POA: Diagnosis not present

## 2022-09-12 DIAGNOSIS — R2689 Other abnormalities of gait and mobility: Secondary | ICD-10-CM | POA: Diagnosis not present

## 2022-09-12 DIAGNOSIS — M5451 Vertebrogenic low back pain: Secondary | ICD-10-CM | POA: Diagnosis not present

## 2022-09-12 DIAGNOSIS — R293 Abnormal posture: Secondary | ICD-10-CM | POA: Diagnosis not present

## 2022-09-12 DIAGNOSIS — M6281 Muscle weakness (generalized): Secondary | ICD-10-CM | POA: Diagnosis not present

## 2022-09-14 DIAGNOSIS — R293 Abnormal posture: Secondary | ICD-10-CM | POA: Diagnosis not present

## 2022-09-14 DIAGNOSIS — M6281 Muscle weakness (generalized): Secondary | ICD-10-CM | POA: Diagnosis not present

## 2022-09-14 DIAGNOSIS — R2689 Other abnormalities of gait and mobility: Secondary | ICD-10-CM | POA: Diagnosis not present

## 2022-09-14 DIAGNOSIS — M5451 Vertebrogenic low back pain: Secondary | ICD-10-CM | POA: Diagnosis not present

## 2022-09-17 DIAGNOSIS — M6281 Muscle weakness (generalized): Secondary | ICD-10-CM | POA: Diagnosis not present

## 2022-09-17 DIAGNOSIS — M5451 Vertebrogenic low back pain: Secondary | ICD-10-CM | POA: Diagnosis not present

## 2022-09-17 DIAGNOSIS — R2689 Other abnormalities of gait and mobility: Secondary | ICD-10-CM | POA: Diagnosis not present

## 2022-09-17 DIAGNOSIS — R293 Abnormal posture: Secondary | ICD-10-CM | POA: Diagnosis not present

## 2022-09-18 DIAGNOSIS — R5383 Other fatigue: Secondary | ICD-10-CM | POA: Diagnosis not present

## 2022-09-18 DIAGNOSIS — M1990 Unspecified osteoarthritis, unspecified site: Secondary | ICD-10-CM | POA: Diagnosis not present

## 2022-09-18 DIAGNOSIS — I10 Essential (primary) hypertension: Secondary | ICD-10-CM | POA: Diagnosis not present

## 2022-09-18 DIAGNOSIS — Z299 Encounter for prophylactic measures, unspecified: Secondary | ICD-10-CM | POA: Diagnosis not present

## 2022-09-19 DIAGNOSIS — M5451 Vertebrogenic low back pain: Secondary | ICD-10-CM | POA: Diagnosis not present

## 2022-09-19 DIAGNOSIS — M6281 Muscle weakness (generalized): Secondary | ICD-10-CM | POA: Diagnosis not present

## 2022-09-19 DIAGNOSIS — R2689 Other abnormalities of gait and mobility: Secondary | ICD-10-CM | POA: Diagnosis not present

## 2022-09-19 DIAGNOSIS — R293 Abnormal posture: Secondary | ICD-10-CM | POA: Diagnosis not present

## 2022-09-26 DIAGNOSIS — M5451 Vertebrogenic low back pain: Secondary | ICD-10-CM | POA: Diagnosis not present

## 2022-09-26 DIAGNOSIS — R2689 Other abnormalities of gait and mobility: Secondary | ICD-10-CM | POA: Diagnosis not present

## 2022-09-26 DIAGNOSIS — M6281 Muscle weakness (generalized): Secondary | ICD-10-CM | POA: Diagnosis not present

## 2022-09-26 DIAGNOSIS — R293 Abnormal posture: Secondary | ICD-10-CM | POA: Diagnosis not present

## 2022-10-01 DIAGNOSIS — M5451 Vertebrogenic low back pain: Secondary | ICD-10-CM | POA: Diagnosis not present

## 2022-10-01 DIAGNOSIS — R2689 Other abnormalities of gait and mobility: Secondary | ICD-10-CM | POA: Diagnosis not present

## 2022-10-01 DIAGNOSIS — M6281 Muscle weakness (generalized): Secondary | ICD-10-CM | POA: Diagnosis not present

## 2022-10-01 DIAGNOSIS — R293 Abnormal posture: Secondary | ICD-10-CM | POA: Diagnosis not present

## 2022-10-03 DIAGNOSIS — M1990 Unspecified osteoarthritis, unspecified site: Secondary | ICD-10-CM | POA: Diagnosis not present

## 2022-10-03 DIAGNOSIS — K219 Gastro-esophageal reflux disease without esophagitis: Secondary | ICD-10-CM | POA: Diagnosis not present

## 2022-10-03 DIAGNOSIS — R490 Dysphonia: Secondary | ICD-10-CM | POA: Diagnosis not present

## 2022-10-03 DIAGNOSIS — J309 Allergic rhinitis, unspecified: Secondary | ICD-10-CM | POA: Diagnosis not present

## 2022-10-04 DIAGNOSIS — M6281 Muscle weakness (generalized): Secondary | ICD-10-CM | POA: Diagnosis not present

## 2022-10-04 DIAGNOSIS — R293 Abnormal posture: Secondary | ICD-10-CM | POA: Diagnosis not present

## 2022-10-04 DIAGNOSIS — M5451 Vertebrogenic low back pain: Secondary | ICD-10-CM | POA: Diagnosis not present

## 2022-10-04 DIAGNOSIS — R2689 Other abnormalities of gait and mobility: Secondary | ICD-10-CM | POA: Diagnosis not present

## 2022-11-09 DIAGNOSIS — Z23 Encounter for immunization: Secondary | ICD-10-CM | POA: Diagnosis not present

## 2022-12-05 DIAGNOSIS — R490 Dysphonia: Secondary | ICD-10-CM | POA: Diagnosis not present

## 2022-12-05 DIAGNOSIS — J309 Allergic rhinitis, unspecified: Secondary | ICD-10-CM | POA: Diagnosis not present

## 2022-12-05 DIAGNOSIS — H6121 Impacted cerumen, right ear: Secondary | ICD-10-CM | POA: Diagnosis not present

## 2022-12-05 DIAGNOSIS — I10 Essential (primary) hypertension: Secondary | ICD-10-CM | POA: Diagnosis not present

## 2022-12-05 DIAGNOSIS — K219 Gastro-esophageal reflux disease without esophagitis: Secondary | ICD-10-CM | POA: Diagnosis not present

## 2022-12-27 DIAGNOSIS — M810 Age-related osteoporosis without current pathological fracture: Secondary | ICD-10-CM | POA: Diagnosis not present

## 2023-01-01 DIAGNOSIS — I10 Essential (primary) hypertension: Secondary | ICD-10-CM | POA: Diagnosis not present

## 2023-01-01 DIAGNOSIS — Z1331 Encounter for screening for depression: Secondary | ICD-10-CM | POA: Diagnosis not present

## 2023-01-01 DIAGNOSIS — M810 Age-related osteoporosis without current pathological fracture: Secondary | ICD-10-CM | POA: Diagnosis not present

## 2023-01-01 DIAGNOSIS — Z1339 Encounter for screening examination for other mental health and behavioral disorders: Secondary | ICD-10-CM | POA: Diagnosis not present

## 2023-01-01 DIAGNOSIS — E7800 Pure hypercholesterolemia, unspecified: Secondary | ICD-10-CM | POA: Diagnosis not present

## 2023-01-01 DIAGNOSIS — Z0000 Encounter for general adult medical examination without abnormal findings: Secondary | ICD-10-CM | POA: Diagnosis not present

## 2023-01-01 DIAGNOSIS — M2550 Pain in unspecified joint: Secondary | ICD-10-CM | POA: Diagnosis not present

## 2023-01-01 DIAGNOSIS — R5383 Other fatigue: Secondary | ICD-10-CM | POA: Diagnosis not present

## 2023-01-01 DIAGNOSIS — Z79899 Other long term (current) drug therapy: Secondary | ICD-10-CM | POA: Diagnosis not present

## 2023-01-01 DIAGNOSIS — Z7189 Other specified counseling: Secondary | ICD-10-CM | POA: Diagnosis not present

## 2023-01-01 DIAGNOSIS — Z299 Encounter for prophylactic measures, unspecified: Secondary | ICD-10-CM | POA: Diagnosis not present

## 2023-01-21 DIAGNOSIS — M5416 Radiculopathy, lumbar region: Secondary | ICD-10-CM | POA: Diagnosis not present

## 2023-02-12 DIAGNOSIS — Z6828 Body mass index (BMI) 28.0-28.9, adult: Secondary | ICD-10-CM | POA: Diagnosis not present

## 2023-02-12 DIAGNOSIS — I10 Essential (primary) hypertension: Secondary | ICD-10-CM | POA: Diagnosis not present

## 2023-02-12 DIAGNOSIS — Z299 Encounter for prophylactic measures, unspecified: Secondary | ICD-10-CM | POA: Diagnosis not present

## 2023-02-13 DIAGNOSIS — I10 Essential (primary) hypertension: Secondary | ICD-10-CM | POA: Diagnosis not present

## 2023-02-13 DIAGNOSIS — M2142 Flat foot [pes planus] (acquired), left foot: Secondary | ICD-10-CM | POA: Diagnosis not present

## 2023-02-13 DIAGNOSIS — M2141 Flat foot [pes planus] (acquired), right foot: Secondary | ICD-10-CM | POA: Diagnosis not present

## 2023-02-13 DIAGNOSIS — K219 Gastro-esophageal reflux disease without esophagitis: Secondary | ICD-10-CM | POA: Diagnosis not present

## 2023-02-13 DIAGNOSIS — J309 Allergic rhinitis, unspecified: Secondary | ICD-10-CM | POA: Diagnosis not present

## 2023-02-26 DIAGNOSIS — H5021 Vertical strabismus, right eye: Secondary | ICD-10-CM | POA: Diagnosis not present

## 2023-02-26 DIAGNOSIS — H5022 Vertical strabismus, left eye: Secondary | ICD-10-CM | POA: Diagnosis not present

## 2023-02-26 DIAGNOSIS — H0102B Squamous blepharitis left eye, upper and lower eyelids: Secondary | ICD-10-CM | POA: Diagnosis not present

## 2023-02-26 DIAGNOSIS — H0102A Squamous blepharitis right eye, upper and lower eyelids: Secondary | ICD-10-CM | POA: Diagnosis not present

## 2023-02-26 DIAGNOSIS — H04123 Dry eye syndrome of bilateral lacrimal glands: Secondary | ICD-10-CM | POA: Diagnosis not present

## 2023-02-26 DIAGNOSIS — H401421 Capsular glaucoma with pseudoexfoliation of lens, left eye, mild stage: Secondary | ICD-10-CM | POA: Diagnosis not present

## 2023-02-26 DIAGNOSIS — H532 Diplopia: Secondary | ICD-10-CM | POA: Diagnosis not present

## 2023-04-02 DIAGNOSIS — Z08 Encounter for follow-up examination after completed treatment for malignant neoplasm: Secondary | ICD-10-CM | POA: Diagnosis not present

## 2023-04-02 DIAGNOSIS — L570 Actinic keratosis: Secondary | ICD-10-CM | POA: Diagnosis not present

## 2023-04-02 DIAGNOSIS — Z85828 Personal history of other malignant neoplasm of skin: Secondary | ICD-10-CM | POA: Diagnosis not present

## 2023-05-02 DIAGNOSIS — H401421 Capsular glaucoma with pseudoexfoliation of lens, left eye, mild stage: Secondary | ICD-10-CM | POA: Diagnosis not present

## 2023-05-02 DIAGNOSIS — H5021 Vertical strabismus, right eye: Secondary | ICD-10-CM | POA: Diagnosis not present

## 2023-05-02 DIAGNOSIS — H5022 Vertical strabismus, left eye: Secondary | ICD-10-CM | POA: Diagnosis not present

## 2023-05-02 DIAGNOSIS — H532 Diplopia: Secondary | ICD-10-CM | POA: Diagnosis not present

## 2023-05-15 DIAGNOSIS — I10 Essential (primary) hypertension: Secondary | ICD-10-CM | POA: Diagnosis not present

## 2023-05-15 DIAGNOSIS — Z299 Encounter for prophylactic measures, unspecified: Secondary | ICD-10-CM | POA: Diagnosis not present

## 2023-05-15 DIAGNOSIS — E7800 Pure hypercholesterolemia, unspecified: Secondary | ICD-10-CM | POA: Diagnosis not present

## 2023-05-15 DIAGNOSIS — M1990 Unspecified osteoarthritis, unspecified site: Secondary | ICD-10-CM | POA: Diagnosis not present

## 2023-06-13 DIAGNOSIS — Z299 Encounter for prophylactic measures, unspecified: Secondary | ICD-10-CM | POA: Diagnosis not present

## 2023-06-13 DIAGNOSIS — M7551 Bursitis of right shoulder: Secondary | ICD-10-CM | POA: Diagnosis not present

## 2023-06-13 DIAGNOSIS — R52 Pain, unspecified: Secondary | ICD-10-CM | POA: Diagnosis not present

## 2023-06-13 DIAGNOSIS — I10 Essential (primary) hypertension: Secondary | ICD-10-CM | POA: Diagnosis not present

## 2023-06-27 DIAGNOSIS — M810 Age-related osteoporosis without current pathological fracture: Secondary | ICD-10-CM | POA: Diagnosis not present

## 2023-08-05 DIAGNOSIS — E8940 Asymptomatic postprocedural ovarian failure: Secondary | ICD-10-CM | POA: Diagnosis not present

## 2023-08-14 DIAGNOSIS — H6591 Unspecified nonsuppurative otitis media, right ear: Secondary | ICD-10-CM | POA: Diagnosis not present

## 2023-08-14 DIAGNOSIS — J309 Allergic rhinitis, unspecified: Secondary | ICD-10-CM | POA: Diagnosis not present

## 2023-08-15 DIAGNOSIS — Z299 Encounter for prophylactic measures, unspecified: Secondary | ICD-10-CM | POA: Diagnosis not present

## 2023-08-15 DIAGNOSIS — I10 Essential (primary) hypertension: Secondary | ICD-10-CM | POA: Diagnosis not present

## 2023-08-15 DIAGNOSIS — M810 Age-related osteoporosis without current pathological fracture: Secondary | ICD-10-CM | POA: Diagnosis not present

## 2023-08-20 DIAGNOSIS — M4120 Other idiopathic scoliosis, site unspecified: Secondary | ICD-10-CM | POA: Diagnosis not present

## 2023-08-20 DIAGNOSIS — M5416 Radiculopathy, lumbar region: Secondary | ICD-10-CM | POA: Diagnosis not present

## 2023-09-11 DIAGNOSIS — M5416 Radiculopathy, lumbar region: Secondary | ICD-10-CM | POA: Diagnosis not present

## 2023-09-25 DIAGNOSIS — D485 Neoplasm of uncertain behavior of skin: Secondary | ICD-10-CM | POA: Diagnosis not present

## 2023-09-25 DIAGNOSIS — H6691 Otitis media, unspecified, right ear: Secondary | ICD-10-CM | POA: Diagnosis not present

## 2023-09-25 DIAGNOSIS — K219 Gastro-esophageal reflux disease without esophagitis: Secondary | ICD-10-CM | POA: Diagnosis not present

## 2023-10-03 DIAGNOSIS — M5416 Radiculopathy, lumbar region: Secondary | ICD-10-CM | POA: Diagnosis not present

## 2023-10-28 DIAGNOSIS — Z23 Encounter for immunization: Secondary | ICD-10-CM | POA: Diagnosis not present

## 2023-11-21 DIAGNOSIS — M549 Dorsalgia, unspecified: Secondary | ICD-10-CM | POA: Diagnosis not present

## 2023-11-21 DIAGNOSIS — L720 Epidermal cyst: Secondary | ICD-10-CM | POA: Diagnosis not present

## 2023-11-21 DIAGNOSIS — Z299 Encounter for prophylactic measures, unspecified: Secondary | ICD-10-CM | POA: Diagnosis not present

## 2023-11-21 DIAGNOSIS — C44319 Basal cell carcinoma of skin of other parts of face: Secondary | ICD-10-CM | POA: Diagnosis not present

## 2023-11-21 DIAGNOSIS — Y92009 Unspecified place in unspecified non-institutional (private) residence as the place of occurrence of the external cause: Secondary | ICD-10-CM | POA: Diagnosis not present

## 2023-11-21 DIAGNOSIS — D485 Neoplasm of uncertain behavior of skin: Secondary | ICD-10-CM | POA: Diagnosis not present

## 2023-11-21 DIAGNOSIS — I10 Essential (primary) hypertension: Secondary | ICD-10-CM | POA: Diagnosis not present

## 2023-11-21 DIAGNOSIS — L738 Other specified follicular disorders: Secondary | ICD-10-CM | POA: Diagnosis not present

## 2023-11-21 DIAGNOSIS — R2689 Other abnormalities of gait and mobility: Secondary | ICD-10-CM | POA: Diagnosis not present

## 2023-12-24 DIAGNOSIS — I10 Essential (primary) hypertension: Secondary | ICD-10-CM | POA: Diagnosis not present

## 2023-12-24 DIAGNOSIS — R42 Dizziness and giddiness: Secondary | ICD-10-CM | POA: Diagnosis not present

## 2023-12-24 DIAGNOSIS — Z299 Encounter for prophylactic measures, unspecified: Secondary | ICD-10-CM | POA: Diagnosis not present

## 2024-01-01 DIAGNOSIS — Z6827 Body mass index (BMI) 27.0-27.9, adult: Secondary | ICD-10-CM | POA: Diagnosis not present

## 2024-01-01 DIAGNOSIS — Z299 Encounter for prophylactic measures, unspecified: Secondary | ICD-10-CM | POA: Diagnosis not present

## 2024-01-01 DIAGNOSIS — I10 Essential (primary) hypertension: Secondary | ICD-10-CM | POA: Diagnosis not present

## 2024-01-07 DIAGNOSIS — Z79899 Other long term (current) drug therapy: Secondary | ICD-10-CM | POA: Diagnosis not present

## 2024-01-07 DIAGNOSIS — Z1339 Encounter for screening examination for other mental health and behavioral disorders: Secondary | ICD-10-CM | POA: Diagnosis not present

## 2024-01-07 DIAGNOSIS — R52 Pain, unspecified: Secondary | ICD-10-CM | POA: Diagnosis not present

## 2024-01-07 DIAGNOSIS — Z0000 Encounter for general adult medical examination without abnormal findings: Secondary | ICD-10-CM | POA: Diagnosis not present

## 2024-01-07 DIAGNOSIS — Z1331 Encounter for screening for depression: Secondary | ICD-10-CM | POA: Diagnosis not present

## 2024-01-07 DIAGNOSIS — M810 Age-related osteoporosis without current pathological fracture: Secondary | ICD-10-CM | POA: Diagnosis not present

## 2024-01-07 DIAGNOSIS — Z7189 Other specified counseling: Secondary | ICD-10-CM | POA: Diagnosis not present

## 2024-01-07 DIAGNOSIS — Z299 Encounter for prophylactic measures, unspecified: Secondary | ICD-10-CM | POA: Diagnosis not present

## 2024-01-07 DIAGNOSIS — E7800 Pure hypercholesterolemia, unspecified: Secondary | ICD-10-CM | POA: Diagnosis not present

## 2024-01-07 DIAGNOSIS — I10 Essential (primary) hypertension: Secondary | ICD-10-CM | POA: Diagnosis not present

## 2024-01-07 DIAGNOSIS — R5383 Other fatigue: Secondary | ICD-10-CM | POA: Diagnosis not present

## 2024-01-09 DIAGNOSIS — M810 Age-related osteoporosis without current pathological fracture: Secondary | ICD-10-CM | POA: Diagnosis not present

## 2024-01-15 DIAGNOSIS — Z299 Encounter for prophylactic measures, unspecified: Secondary | ICD-10-CM | POA: Diagnosis not present

## 2024-01-15 DIAGNOSIS — I10 Essential (primary) hypertension: Secondary | ICD-10-CM | POA: Diagnosis not present
